# Patient Record
Sex: Male | Born: 1980 | Race: Black or African American | Hispanic: No | Marital: Married | Smoking: Former smoker
Health system: Southern US, Community
[De-identification: ages and names within clinical notes are randomized; demographics above are authoritative.]

## PROBLEM LIST (undated history)

## (undated) DIAGNOSIS — J45909 Unspecified asthma, uncomplicated: Secondary | ICD-10-CM

## (undated) HISTORY — PX: HIP SURGERY: SHX245

---
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
## 2003-08-16 ENCOUNTER — Emergency Department (HOSPITAL_COMMUNITY): Admission: EM | Admit: 2003-08-16 | Discharge: 2003-08-16 | Payer: Self-pay | Admitting: Emergency Medicine

## 2004-12-28 ENCOUNTER — Emergency Department (HOSPITAL_COMMUNITY): Admission: EM | Admit: 2004-12-28 | Discharge: 2004-12-28 | Payer: Self-pay | Admitting: Emergency Medicine

## 2007-04-02 ENCOUNTER — Emergency Department (HOSPITAL_COMMUNITY): Admission: EM | Admit: 2007-04-02 | Discharge: 2007-04-02 | Payer: Self-pay | Admitting: Emergency Medicine

## 2007-04-05 ENCOUNTER — Emergency Department (HOSPITAL_COMMUNITY): Admission: EM | Admit: 2007-04-05 | Discharge: 2007-04-05 | Payer: Self-pay | Admitting: Family Medicine

## 2007-07-19 ENCOUNTER — Emergency Department (HOSPITAL_COMMUNITY): Admission: EM | Admit: 2007-07-19 | Discharge: 2007-07-19 | Payer: Self-pay | Admitting: Family Medicine

## 2007-08-26 ENCOUNTER — Emergency Department (HOSPITAL_COMMUNITY): Admission: EM | Admit: 2007-08-26 | Discharge: 2007-08-26 | Payer: Self-pay | Admitting: Emergency Medicine

## 2008-10-06 ENCOUNTER — Emergency Department (HOSPITAL_COMMUNITY): Admission: EM | Admit: 2008-10-06 | Discharge: 2008-10-06 | Payer: Self-pay | Admitting: Family Medicine

## 2010-08-06 ENCOUNTER — Inpatient Hospital Stay (INDEPENDENT_AMBULATORY_CARE_PROVIDER_SITE_OTHER)
Admission: RE | Admit: 2010-08-06 | Discharge: 2010-08-06 | Disposition: A | Discharge status: Home / Self Care | Payer: Self-pay | Source: Ambulatory Visit | Attending: Emergency Medicine | Admitting: Emergency Medicine

## 2010-08-06 DIAGNOSIS — IMO0002 Reserved for concepts with insufficient information to code with codable children: Secondary | ICD-10-CM

## 2010-08-20 ENCOUNTER — Inpatient Hospital Stay (INDEPENDENT_AMBULATORY_CARE_PROVIDER_SITE_OTHER)
Admission: RE | Admit: 2010-08-20 | Discharge: 2010-08-20 | Disposition: A | Discharge status: Home / Self Care | Payer: Self-pay | Source: Ambulatory Visit | Attending: Family Medicine | Admitting: Family Medicine

## 2010-08-20 DIAGNOSIS — IMO0002 Reserved for concepts with insufficient information to code with codable children: Secondary | ICD-10-CM

## 2010-08-20 DIAGNOSIS — A64 Unspecified sexually transmitted disease: Secondary | ICD-10-CM

## 2010-08-21 LAB — GC/CHLAMYDIA PROBE AMP, GENITAL
Chlamydia, DNA Probe: NEGATIVE
GC Probe Amp, Genital: NEGATIVE

## 2010-10-20 ENCOUNTER — Emergency Department (HOSPITAL_COMMUNITY)
Admission: EM | Admit: 2010-10-20 | Discharge: 2010-10-20 | Disposition: A | Discharge status: Home / Self Care | Payer: Self-pay | Attending: Emergency Medicine | Admitting: Emergency Medicine

## 2010-10-20 DIAGNOSIS — W261XXA Contact with sword or dagger, initial encounter: Secondary | ICD-10-CM | POA: Insufficient documentation

## 2010-10-20 DIAGNOSIS — S61209A Unspecified open wound of unspecified finger without damage to nail, initial encounter: Secondary | ICD-10-CM | POA: Insufficient documentation

## 2010-10-20 DIAGNOSIS — W260XXA Contact with knife, initial encounter: Secondary | ICD-10-CM | POA: Insufficient documentation

## 2010-10-31 ENCOUNTER — Emergency Department (HOSPITAL_COMMUNITY)
Admission: EM | Admit: 2010-10-31 | Discharge: 2010-10-31 | Disposition: A | Discharge status: Home / Self Care | Payer: Self-pay | Attending: Emergency Medicine | Admitting: Emergency Medicine

## 2010-10-31 ENCOUNTER — Emergency Department (HOSPITAL_COMMUNITY): Payer: Self-pay

## 2010-10-31 DIAGNOSIS — S62639A Displaced fracture of distal phalanx of unspecified finger, initial encounter for closed fracture: Secondary | ICD-10-CM | POA: Insufficient documentation

## 2011-08-04 ENCOUNTER — Encounter (HOSPITAL_COMMUNITY): Payer: Self-pay | Admitting: *Deleted

## 2011-08-04 ENCOUNTER — Emergency Department (HOSPITAL_COMMUNITY)
Admission: EM | Admit: 2011-08-04 | Discharge: 2011-08-04 | Disposition: A | Discharge status: Home / Self Care | Payer: Self-pay | Attending: Emergency Medicine | Admitting: Emergency Medicine

## 2011-08-04 DIAGNOSIS — F411 Generalized anxiety disorder: Secondary | ICD-10-CM | POA: Insufficient documentation

## 2011-08-04 DIAGNOSIS — F419 Anxiety disorder, unspecified: Secondary | ICD-10-CM

## 2011-08-04 DIAGNOSIS — L259 Unspecified contact dermatitis, unspecified cause: Secondary | ICD-10-CM | POA: Insufficient documentation

## 2011-08-04 DIAGNOSIS — F17200 Nicotine dependence, unspecified, uncomplicated: Secondary | ICD-10-CM | POA: Insufficient documentation

## 2011-08-04 MED ORDER — PREDNISONE 20 MG PO TABS
60.0000 mg | ORAL_TABLET | Freq: Once | ORAL | Status: AC
  Administered 2011-08-04: 60 mg via ORAL
  Filled 2011-08-04: qty 3

## 2011-08-04 MED ORDER — PREDNISONE 10 MG PO TABS: 40.0000 mg | ORAL_TABLET | Freq: Every day | ORAL | Status: DC

## 2011-08-04 NOTE — ED Provider Notes (Signed)
History     CSN: [id number]  Arrival date & time 08/04/11  0207   First MD Initiated Contact with Patient 08/04/11 [phone]      Chief Complaint  Patient presents with  . Rash    (Consider location/radiation/quality/duration/timing/severity/associated sxs/prior treatment) Patient is a 31 y.o. male presenting with rash. The history is provided by the patient.  Rash  This is a chronic problem. The current episode started more than 1 week ago.   patient with complaint of rash to the back of both hands for the past 2 months. 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 Patient is also concerned about his heart going fast when he arrives heart rate was 105. Patient denied any crack or cocaine use but admitted to smoking marijuana. Denies chest pain shortness of breath headache abdominal pain nausea or vomiting. Also denies fever.  History reviewed. No pertinent past medical history.  History reviewed. No pertinent past surgical history.  No family history on file.  History  Substance Use Topics  . Smoking status: Current Everyday Smoker -- 0.5 packs/day  . Smokeless tobacco: Not on file  . Alcohol Use: Yes      Review of Systems  Constitutional: Negative for fever, chills, diaphoresis and fatigue.  HENT: Negative for congestion, sore throat and neck pain.   Eyes: Positive for redness. Negative for visual disturbance.  Respiratory: Negative for shortness of breath.   Cardiovascular: Negative for chest pain.  Gastrointestinal: Negative for nausea, vomiting and abdominal pain.  Genitourinary: Negative for dysuria.  Musculoskeletal: Negative for back pain.  Skin: Positive for rash.  Neurological: Negative for tremors, speech difficulty, weakness, numbness and headaches.    Allergies  Review of patient's allergies indicates no known allergies.  Home Medications   Current Outpatient Rx  Name Route Sig Dispense  Refill  . PREDNISONE 10 MG PO TABS Oral Take 4 tablets (40 mg total) by mouth daily. 20 tablet 0    BP 146/85  Pulse 128  Temp 98.8 F (37.1 C) (Oral)  Resp 23  Ht 5\' 7"  (1.702 m)  Wt 200 lb (90.719 kg)  BMI 31.32 kg/m2  SpO2 98%  Physical Exam  Nursing note and vitals reviewed. Constitutional: He is oriented to person, place, and time. He appears well-developed and well-nourished. No distress.  HENT:  Head: Normocephalic and atraumatic.  Mouth/Throat: Oropharynx is clear and moist.  Eyes: Conjunctivae and EOM are normal. Pupils are equal, round, and reactive to light.  Neck: Normal range of motion. Neck supple.  Cardiovascular: Regular rhythm, normal heart sounds and intact distal pulses.   No murmur heard.      Tachycardia ranging from 105-124.  Pulmonary/Chest: Effort normal and breath sounds normal. No respiratory distress. He has no wheezes. He has no rales. He exhibits no tenderness.  Abdominal: Soft. Bowel sounds are normal. There is no tenderness.  Musculoskeletal: Normal range of motion. He exhibits no edema and no tenderness.  Neurological: He is alert and oriented to person, place, and time. No cranial nerve deficit. He exhibits normal muscle tone. Coordination normal.  Skin: Skin is warm. Rash noted. There is erythema.       Rash with skin cracking at the knuckles on the backside of both hands along with some chronic skin thickening no vesicular lesions. Palms are normal. Suggestive of a chemical irritant or contact dermatitis type reaction.  Psychiatric:       Anxious  ED Course  Procedures (including critical care time)  Labs Reviewed - No data to display No results found.  Date: 08/04/2011  Rate: 124  Rhythm: sinus tachycardia  QRS Axis: normal  Intervals: normal  ST/T Wave abnormalities: nonspecific ST changes  Conduction Disutrbances:none  Narrative Interpretation:   Old EKG Reviewed: none available    1. Contact dermatitis   2. Anxiety        MDM  The patient with dried cracking dorsal part of the hands with cracking of the knuckles. Suggestive of ischemia or tender contact type dermatitis. Palms without any sniffing abnormalities. No other rash anywhere else. This will be treated with prednisone first dose given in the emergency apartment 60 mg and will go home with 40 mg for 5 days. Patient also try to wear gloves at work and keep his hands protected. If the prednisone does not help then recommend going with a trial of Lotrimin cream.   Second concern is been the patient's heart rate when at rest it goes down to about 105 as he gets worked up in concerned about the heart rate goes up to around 124 patient does admit to some marijuana use denies any crack or cocaine use. Denies any other systemic symptoms. Heart rate at rest like a settle be 105 respirations are fine sats are fine blood pressures slightly elevated.         Shelda Jakes, MD 08/04/11 [phone]

## 2011-08-04 NOTE — ED Notes (Addendum)
Pt pacing floor, questioning if I will need to put him to sleep, anxious, HR 130s, placed on monitior and reassurred. Given 2 warm blankets. Explained that we will not be putting him to sleep at this time. Questioned pt on whether he had used any illegal drugs, pt denies drug use stated, "I only smoke cigarettes and smoked pot" Hr comes down with reassurance. Pt states, "I need to calm down, I just need to bring it down. Its those cigarettes that do it"

## 2011-08-04 NOTE — ED Notes (Addendum)
Bilateral hand rash redness & raised bumps noted. R>L. Onset ~ 1-2 months ago. Describes skin as dry & cracked, primarily over the joints. Skin not intact, open cracks noted. Has been applying neosporin. Has had hand both covered and uncovered (wrapped and unwrapped). Rates pain 5/10. Some intermitant itching. Denies any known injury.

## 2011-08-04 NOTE — ED Notes (Signed)
The patient is AOx4 and comfortable with his discharge instructions. 

## 2011-08-31 ENCOUNTER — Emergency Department (INDEPENDENT_AMBULATORY_CARE_PROVIDER_SITE_OTHER): Payer: Self-pay

## 2011-08-31 ENCOUNTER — Encounter (HOSPITAL_COMMUNITY): Payer: Self-pay | Admitting: *Deleted

## 2011-08-31 ENCOUNTER — Emergency Department (INDEPENDENT_AMBULATORY_CARE_PROVIDER_SITE_OTHER)
Admission: EM | Admit: 2011-08-31 | Discharge: 2011-08-31 | Disposition: A | Discharge status: Home / Self Care | Payer: Self-pay | Source: Home / Self Care | Attending: Emergency Medicine | Admitting: Emergency Medicine

## 2011-08-31 DIAGNOSIS — M546 Pain in thoracic spine: Secondary | ICD-10-CM

## 2011-08-31 HISTORY — DX: Unspecified asthma, uncomplicated: J45.909

## 2011-08-31 MED ORDER — MELOXICAM 7.5 MG PO TABS: 7.5000 mg | ORAL_TABLET | Freq: Every day | ORAL | Status: AC

## 2011-08-31 NOTE — ED Notes (Signed)
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

## 2011-08-31 NOTE — ED Provider Notes (Addendum)
History     CSN: [id number]  Arrival date & time 08/31/11  1905   First MD Initiated Contact with Patient 08/31/11 1906      Chief Complaint  Patient presents with  . Shoulder Pain    (Consider location/radiation/quality/duration/timing/severity/associated sxs/prior treatment) HPI Comments: Daniel Joyce describes having  Constant pain on his back (points to interscapular region), similar to a pulling sensation" recalls no injuries or falls," I work in a Airline pilot", don't lift anything heavy, I want to make sure its not my lungs" No cough, no SOB, no fevers, no unintentional weight loss. No nausea. No increased sweating.  Patient is a 31 y.o. male presenting with shoulder pain. The history is provided by the patient.  Shoulder Pain This is a new problem. The current episode started more than 1 week ago. The problem occurs constantly. The problem has not changed since onset.Pertinent negatives include no chest pain, no abdominal pain, no headaches and no shortness of breath. The symptoms are aggravated by bending. Nothing relieves the symptoms. He has tried nothing for the symptoms. The treatment provided no relief.    Past Medical History  Diagnosis Date  . Asthma     History reviewed. No pertinent past surgical history.  Family History  Problem Relation Age of Onset  . Family history unknown: Yes    History  Substance Use Topics  . Smoking status: Current Everyday Smoker -- 0.5 packs/day    Types: Cigarettes  . Smokeless tobacco: Not on file  . Alcohol Use: Yes      Review of Systems  Constitutional: Positive for activity change. Negative for fever, chills, diaphoresis, appetite change and fatigue.  HENT: Negative for neck pain.   Respiratory: Negative for cough, chest tightness, shortness of breath and stridor.   Cardiovascular: Negative for chest pain, palpitations and leg swelling.  Gastrointestinal: Negative for nausea and abdominal pain.  Genitourinary: Negative  for dysuria, flank pain and penile swelling.  Musculoskeletal: Positive for back pain and gait problem. Negative for myalgias and joint swelling.  Skin: Negative for pallor, rash and wound.  Neurological: Negative for weakness, numbness and headaches.    Allergies  Review of patient's allergies indicates no known allergies.  Home Medications   Current Outpatient Rx  Name Route Sig Dispense Refill  . MELOXICAM 7.5 MG PO TABS Oral Take 1 tablet (7.5 mg total) by mouth daily. 14 tablet 0  . PREDNISONE 10 MG PO TABS Oral Take 4 tablets (40 mg total) by mouth daily. 20 tablet 0    BP 140/90  Pulse 90  Temp 98.6 F (37 C) (Oral)  Resp 18  SpO2 100%  Physical Exam  Nursing note reviewed. Constitutional: Vital signs are normal. He appears well-developed and well-nourished.  Non-toxic appearance. He does not have a sickly appearance. He does not appear ill. No distress.  Eyes: Conjunctivae are normal.  Cardiovascular: Normal rate, regular rhythm, normal heart sounds and normal pulses.   Pulmonary/Chest: Breath sounds normal. No accessory muscle usage. No apnea, not tachypneic and not bradypneic. No respiratory distress. He has no decreased breath sounds. He has no wheezes. He has no rhonchi. He has no rales.    Musculoskeletal: He exhibits tenderness. He exhibits no edema.  Neurological: He is alert.  Skin: No rash noted. No erythema.    ED Course  Procedures (including critical care time)  Labs Reviewed - No data to display Dg Chest 2 View  08/31/2011  *RADIOLOGY REPORT*  Clinical Data: Left shoulder pain,  tobacco use.  CHEST - 2 VIEW  Comparison: 05/08/1999 by report only  Findings: Lungs clear.  Heart size and pulmonary vascularity normal.  No effusion.  Visualized bones unremarkable.  IMPRESSION: No acute disease  Original Report Authenticated By: Thora Lance III, M.D.     1. Thoracic back pain    normal chest x-ray.    MDM  Chest x-ray-rhythm strip physical exam  unremarkable. Unable to reproduce pain with palpation but certain movements reproduced the pain. Discussed with patient hat symptoms should warrant further evaluation. A prescription of meloxicam to be completed in 2 weeks       Jimmie Molly, MD 08/31/11 1952  Jimmie Molly, MD 09/01/11 1652  Jimmie Molly, MD 09/01/11 [phone]

## 2012-09-13 ENCOUNTER — Emergency Department (HOSPITAL_COMMUNITY)
Admission: EM | Admit: 2012-09-13 | Discharge: 2012-09-13 | Disposition: A | Discharge status: Home / Self Care | Payer: Self-pay | Attending: Emergency Medicine | Admitting: Emergency Medicine

## 2012-09-13 ENCOUNTER — Emergency Department (HOSPITAL_COMMUNITY): Payer: Self-pay

## 2012-09-13 ENCOUNTER — Encounter (HOSPITAL_COMMUNITY): Payer: Self-pay | Admitting: Emergency Medicine

## 2012-09-13 DIAGNOSIS — R059 Cough, unspecified: Secondary | ICD-10-CM | POA: Insufficient documentation

## 2012-09-13 DIAGNOSIS — J45909 Unspecified asthma, uncomplicated: Secondary | ICD-10-CM | POA: Insufficient documentation

## 2012-09-13 DIAGNOSIS — F22 Delusional disorders: Secondary | ICD-10-CM | POA: Insufficient documentation

## 2012-09-13 DIAGNOSIS — R0789 Other chest pain: Secondary | ICD-10-CM | POA: Insufficient documentation

## 2012-09-13 DIAGNOSIS — R05 Cough: Secondary | ICD-10-CM | POA: Insufficient documentation

## 2012-09-13 DIAGNOSIS — J029 Acute pharyngitis, unspecified: Secondary | ICD-10-CM | POA: Insufficient documentation

## 2012-09-13 DIAGNOSIS — J3489 Other specified disorders of nose and nasal sinuses: Secondary | ICD-10-CM | POA: Insufficient documentation

## 2012-09-13 DIAGNOSIS — F411 Generalized anxiety disorder: Secondary | ICD-10-CM | POA: Insufficient documentation

## 2012-09-13 DIAGNOSIS — F419 Anxiety disorder, unspecified: Secondary | ICD-10-CM

## 2012-09-13 DIAGNOSIS — F17200 Nicotine dependence, unspecified, uncomplicated: Secondary | ICD-10-CM | POA: Insufficient documentation

## 2012-09-13 NOTE — ED Notes (Signed)
PT. REPORTS SORE THROAT AND LEFT LATERAL ABDOMINAL PAIN ONSET AFTER SMOKING " WEED" THIS MORNING . PT. APPEARS ANXIOUS AT TRIAGE . NO NAUSEA OR VOMITTING . RESPIRATIONS UNLABORED .

## 2012-09-13 NOTE — ED Notes (Signed)
Pt taken to xray 

## 2012-09-13 NOTE — ED Provider Notes (Signed)
CSN: [id number]     Arrival date & time 09/13/12  0530 History   First MD Initiated Contact with Patient 09/13/12 [phone]     Chief Complaint  Patient presents with  . Sore Throat   (Consider location/radiation/quality/duration/timing/severity/associated sxs/prior Treatment) Patient is a 32 y.o. male presenting with pharyngitis. The history is provided by the patient. No language interpreter was used.  Sore Throat This is a new problem. The current episode started today. Associated symptoms include chest pain, coughing and a sore throat. Pertinent negatives include no chills, fever, myalgias, nausea, rash or vomiting. Associated symptoms comments: He reports sore throat, chest tightness and left side pain since Saturday (3 days ago). No difficulty swallowing, he has been eating and drinking normally. He reports smoking a "black and mild" this morning but uses marijuana regularly with similar symptoms. No fever, N, V..    Past Medical History  Diagnosis Date  . Asthma    History reviewed. No pertinent past surgical history. No family history on file. History  Substance Use Topics  . Smoking status: Current Every Day Smoker -- 0.50 packs/day    Types: Cigarettes  . Smokeless tobacco: Not on file  . Alcohol Use: Yes    Review of Systems  Constitutional: Negative for fever and chills.  HENT: Positive for sore throat and rhinorrhea. Negative for trouble swallowing.   Respiratory: Positive for cough. Negative for shortness of breath.   Cardiovascular: Positive for chest pain. Negative for palpitations.       Chest pain with cough.  Gastrointestinal: Negative.  Negative for nausea and vomiting.  Musculoskeletal: Negative.  Negative for myalgias.  Skin: Negative.  Negative for rash.    Allergies  Review of patient's allergies indicates no known allergies.  Home Medications  No current outpatient prescriptions on file. BP 160/87  Pulse 101  Temp(Src) 98.7 F (37.1 C) (Oral)  Resp  21  SpO2 100% Physical Exam  Constitutional: He is oriented to person, place, and time. He appears well-developed and well-nourished.  HENT:  Head: Normocephalic.  Mouth/Throat: Oropharynx is clear and moist. No oropharyngeal exudate.  Neck: Normal range of motion. Neck supple.  Cardiovascular: Normal rate and regular rhythm.   No murmur heard. Pulmonary/Chest: Effort normal and breath sounds normal. He has no wheezes. He has no rales. He exhibits no tenderness.  Abdominal: Soft. Bowel sounds are normal. There is no tenderness. There is no rebound and no guarding.  Musculoskeletal: Normal range of motion.  Neurological: He is alert and oriented to person, place, and time.  Skin: Skin is warm and dry. No rash noted.  Psychiatric: His mood appears anxious. Thought content is paranoid.    ED Course  Procedures (including critical care time) Labs Review Labs Reviewed  URINE RAPID DRUG SCREEN (HOSP PERFORMED)  URINALYSIS, ROUTINE W REFLEX MICROSCOPIC   Results for orders placed during the hospital encounter of 08/20/10  GC/CHLAMYDIA PROBE AMP, GENITAL      Result Value Range   GC Probe Amp, Genital NEGATIVE  NEGATIVE   Chlamydia, DNA Probe NEGATIVE  NEGATIVE    Imaging Review No results found.  MDM  No diagnosis found. 1. Pharyngitis 2. Anxiety He appears anxious, borderline paranoid, with repeated questions focusing on what is wrong and "am I going to die" suggesting an irrational thought process. Considered potential necessity of getting [facility] involved, however, there is a friend at bedside who states when he is not smoking marijuana he does not behave in this way. Patient discharged home.  Arnoldo Hooker, PA-C 09/16/12 2257

## 2012-09-13 NOTE — ED Notes (Signed)
Patient is anxious. He wants to know if he has cancer. He has smoked marijuana and has been anxious since. States he normally doesn't smoke because it makes him very paranoid. Encouraged pt not to smoke marijuana. reasssurance given

## 2012-09-13 NOTE — ED Notes (Addendum)
Report received care assumed of pt. Pt is in xray.

## 2012-09-16 NOTE — ED Provider Notes (Signed)
Medical screening examination/treatment/procedure(s) were performed by non-physician practitioner and as supervising physician I was immediately available for consultation/collaboration.  Jasmine Awe, MD 09/16/12 2300

## 2012-12-14 ENCOUNTER — Encounter (HOSPITAL_COMMUNITY): Payer: Self-pay | Admitting: Emergency Medicine

## 2012-12-14 ENCOUNTER — Emergency Department (HOSPITAL_COMMUNITY): Payer: Medicaid Other

## 2012-12-14 ENCOUNTER — Other Ambulatory Visit: Payer: Self-pay

## 2012-12-14 ENCOUNTER — Emergency Department (HOSPITAL_COMMUNITY)
Admission: EM | Admit: 2012-12-14 | Discharge: 2012-12-14 | Disposition: A | Discharge status: Home / Self Care | Payer: Medicaid Other | Attending: Emergency Medicine | Admitting: Emergency Medicine

## 2012-12-14 DIAGNOSIS — R0789 Other chest pain: Secondary | ICD-10-CM | POA: Insufficient documentation

## 2012-12-14 DIAGNOSIS — J45909 Unspecified asthma, uncomplicated: Secondary | ICD-10-CM | POA: Insufficient documentation

## 2012-12-14 DIAGNOSIS — F17200 Nicotine dependence, unspecified, uncomplicated: Secondary | ICD-10-CM | POA: Insufficient documentation

## 2012-12-14 LAB — CBC
HCT: 45.3 % (ref 39.0–52.0)
MCH: 30.4 pg (ref 26.0–34.0)
MCV: 86.6 fL (ref 78.0–100.0)
Platelets: 301 10*3/uL (ref 150–400)
RBC: 5.23 MIL/uL (ref 4.22–5.81)
RDW: 13.7 % (ref 11.5–15.5)
WBC: 10.9 10*3/uL — ABNORMAL HIGH (ref 4.0–10.5)

## 2012-12-14 LAB — BASIC METABOLIC PANEL
BUN: 13 mg/dL (ref 6–23)
CO2: 29 mEq/L (ref 19–32)
Calcium: 9.7 mg/dL (ref 8.4–10.5)
Chloride: 101 mEq/L (ref 96–112)
Creatinine, Ser: 0.78 mg/dL (ref 0.50–1.35)

## 2012-12-14 LAB — POCT I-STAT TROPONIN I: Troponin i, poc: 0 ng/mL (ref 0.00–0.08)

## 2012-12-14 MED ORDER — OMEPRAZOLE 20 MG PO CPDR: 20.0000 mg | DELAYED_RELEASE_CAPSULE | Freq: Every day | ORAL | Status: DC

## 2012-12-14 NOTE — ED Notes (Addendum)
Pt. reports intermittent  left chest pain onset 4 days ago , denies SOB /nausea or diaphoresis. No cough or chest congestion . No chest pain at triage .

## 2012-12-14 NOTE — ED Notes (Signed)
Patient states he has mild chest pain intermittently last 4 days. Pain is sharp on left side, denies radiation down any arm or back etc, no associated n/v, diaphoresis.  No activity associated with pain and no specific activity relieves the pain.  Pain lasts seconds at a time.    Patient states that he has been eating a lot of hot food lately, not sure of symptoms of indigestion.  Denies regular alcohol use, does smoke regularly.    At the moment patient denies pain, sob, n/v, lightheadedness, dizziness, h/a, no pain or swelling in legs, no recent travel with long plane or train rides.  Patient states that he is otherwise a healthy person.

## 2012-12-14 NOTE — ED Notes (Signed)
MD at bedside. 

## 2012-12-14 NOTE — ED Notes (Signed)
Vital signs stable. 

## 2012-12-14 NOTE — ED Provider Notes (Signed)
CSN: [id number]     Arrival date & time 12/14/12  2007 History   First MD Initiated Contact with Patient 12/14/12 2112     Chief Complaint  Patient presents with  . Chest Pain   HPI  History provided by the patient. Patient is a 32 year old male with history of asthma who presents with concerns for brief episodes of left chest pain for the past 4-5 days. Patient reports feeling a sharp pain in his left chest and left upper abdomen area intermittently. Pain may last only a few seconds. It occurs infrequently throughout the day. He has not associated any particular activity with the pain. It may occur at rest or with activity. It does not seem aggravated with eating or drinking. Patient does mention that he has had increased spicy foods with increased hot sauces recently. He denies any belching or burning in the chest or throat. No prior history of heartburn symptoms. He denies any associated shortness of breath, diaphoresis or nausea. No recent illnesses or cough.    Past Medical History  Diagnosis Date  . Asthma    Past Surgical History  Procedure Laterality Date  . Hip surgery     No family history on file. History  Substance Use Topics  . Smoking status: Current Every Day Smoker -- 0.50 packs/day    Types: Cigarettes  . Smokeless tobacco: Not on file  . Alcohol Use: Yes    Review of Systems  Constitutional: Negative for fever, chills, diaphoresis and fatigue.  Respiratory: Negative for cough and shortness of breath.   Cardiovascular: Positive for chest pain. Negative for palpitations and leg swelling.  Gastrointestinal: Negative for nausea, vomiting and abdominal pain.  All other systems reviewed and are negative.    Allergies  Review of patient's allergies indicates no known allergies.  Home Medications  No current outpatient prescriptions on file. BP 142/82  Pulse 109  Temp(Src) 99 F (37.2 C) (Oral)  Resp 26  Wt 184 lb 4.8 oz (83.598 kg)  SpO2 99% Physical Exam   Nursing note and vitals reviewed. Constitutional: He is oriented to person, place, and time. He appears well-developed and well-nourished. No distress.  HENT:  Head: Normocephalic.  Eyes: Conjunctivae are normal.  Cardiovascular: Normal rate and regular rhythm.   No murmur heard. Pulmonary/Chest: Effort normal and breath sounds normal. No respiratory distress. He has no wheezes. He has no rales.  Abdominal: Soft. There is no tenderness. There is no rebound and no guarding.  Neurological: He is alert and oriented to person, place, and time.  Skin: Skin is warm. He is not diaphoretic.  Psychiatric: He has a normal mood and affect.    ED Course  Procedures   DIAGNOSTIC STUDIES: Oxygen Saturation is 99% on room air.    COORDINATION OF CARE:  Nursing notes reviewed. Vital signs reviewed. Initial pt interview and examination performed.   9:20 PM patient seen and evaluated. Patient resting comfortably appears well in no acute distress. He has regular heart rate with regular rhythm and no murmurs on exam. He has very atypical brief sharp left chest pain lasting only a few seconds intermittently for the past 4 days. Symptoms atypical for ACS. Patient without any significant risk factors except for smoking. Discussed work up plan with pt at bedside, which includes a sting, EKG and chest x-ray. Pt agrees with plan.   labs, chest x-ray, EKG and troponin unremarkable. Patient is low risk for ACS with atypical symptoms. No significant risk factors or symptoms for  PE. At this time do not suspect any concerning or emergent cause of his brief chest pain symptoms. Patient may be discharged with instructions to followup with PCP. He was also given strict return precautions.  Results for orders placed during the hospital encounter of 12/14/12  CBC      Result Value Range   WBC 10.9 (*) 4.0 - 10.5 K/uL   RBC 5.23  4.22 - 5.81 MIL/uL   Hemoglobin 15.9  13.0 - 17.0 g/dL   HCT 78.2  95.6 - 21.3 %   MCV  86.6  78.0 - 100.0 fL   MCH 30.4  26.0 - 34.0 pg   MCHC 35.1  30.0 - 36.0 g/dL   RDW 08.6  57.8 - 46.9 %   Platelets 301  150 - 400 K/uL  BASIC METABOLIC PANEL      Result Value Range   Sodium 139  135 - 145 mEq/L   Potassium 4.7  3.5 - 5.1 mEq/L   Chloride 101  96 - 112 mEq/L   CO2 29  19 - 32 mEq/L   Glucose, Bld 103 (*) 70 - 99 mg/dL   BUN 13  6 - 23 mg/dL   Creatinine, Ser 6.29  0.50 - 1.35 mg/dL   Calcium 9.7  8.4 - 52.8 mg/dL   GFR calc non Af Amer >90  >90 mL/min   GFR calc Af Amer >90  >90 mL/min  POCT I-STAT TROPONIN I      Result Value Range   Troponin i, poc 0.00  0.00 - 0.08 ng/mL   Comment 3                Imaging Review Dg Chest 2 View  12/14/2012   CLINICAL DATA:  Chest pain.  EXAM: CHEST  2 VIEW  COMPARISON:  September 13, 2012.  FINDINGS: The heart size and mediastinal contours are within normal limits. Both lungs are clear. No pleural effusion or pneumothorax is noted. The visualized skeletal structures are unremarkable.  IMPRESSION: No acute cardiopulmonary abnormality seen.   Electronically Signed   By: Roque Lias M.D.   On: 12/14/2012 20:52    EKG Interpretation   None      Date: 12/14/2012  Rate: 97  Rhythm: normal sinus rhythm  QRS Axis: normal  Intervals: normal  ST/T Wave abnormalities: nonspecific ST changes  Conduction Disutrbances:none  Narrative Interpretation:   Old EKG Reviewed: none available    MDM   1. Atypical chest pain        Angus Seller, PA-C 12/14/12 2200

## 2012-12-15 NOTE — ED Provider Notes (Signed)
Medical screening examination/treatment/procedure(s) were performed by non-physician practitioner and as supervising physician I was immediately available for consultation/collaboration.  EKG Interpretation    Date/Time:  Wednesday December 14 2012 21:16:02 EST Ventricular Rate:  97 PR Interval:  141 QRS Duration: 103 QT Interval:  338 QTC Calculation: 429 R Axis:   45 Text Interpretation:  Sinus rhythm RSR' in V1 or V2, probably normal variant No significant change since last tracing Confirmed by Loran Auguste  MD, Jaryd Drew [phone]) on 12/14/2012 11:53:29 PM              Randa Spike Mort Sawyers, MD 12/15/12 1625

## 2013-01-02 ENCOUNTER — Telehealth: Payer: Self-pay | Admitting: Emergency Medicine

## 2013-01-02 NOTE — Telephone Encounter (Signed)
Burnadette from [facility] called wanting our NPI for pt.  Pt has never been here but he is unable to get an appt here w/in 1 month He is needing a physical.... Auth a x1 visit but adv Burnadette that pt needs to est care w/us

## 2014-01-04 IMAGING — CR DG CHEST 2V
2 series · 2 of 2 positions shown · non-contrast
Comparison: September 13, 2012.

CLINICAL DATA: Chest pain.

EXAM:
CHEST  2 VIEW

[w chest pa]
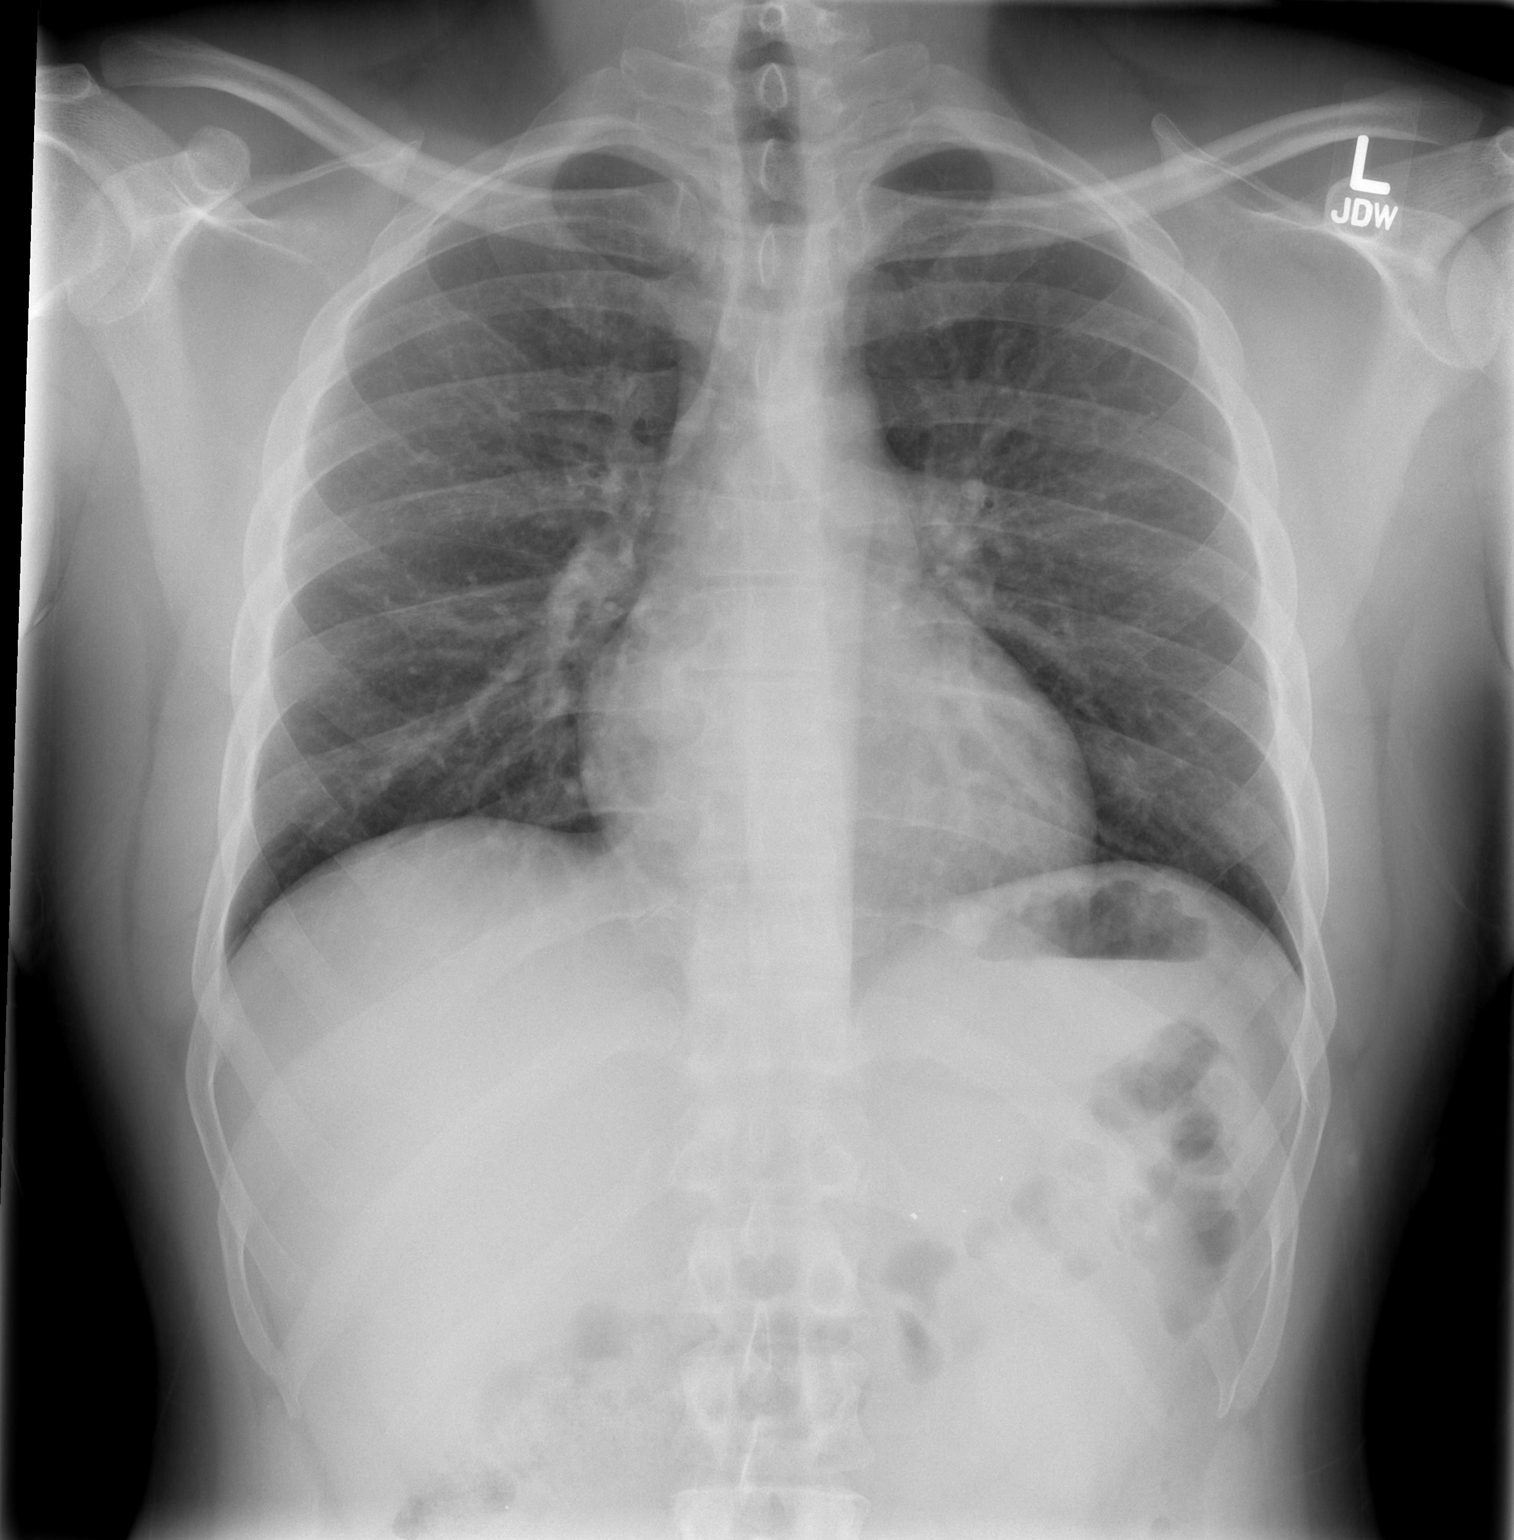

[w chest lat]
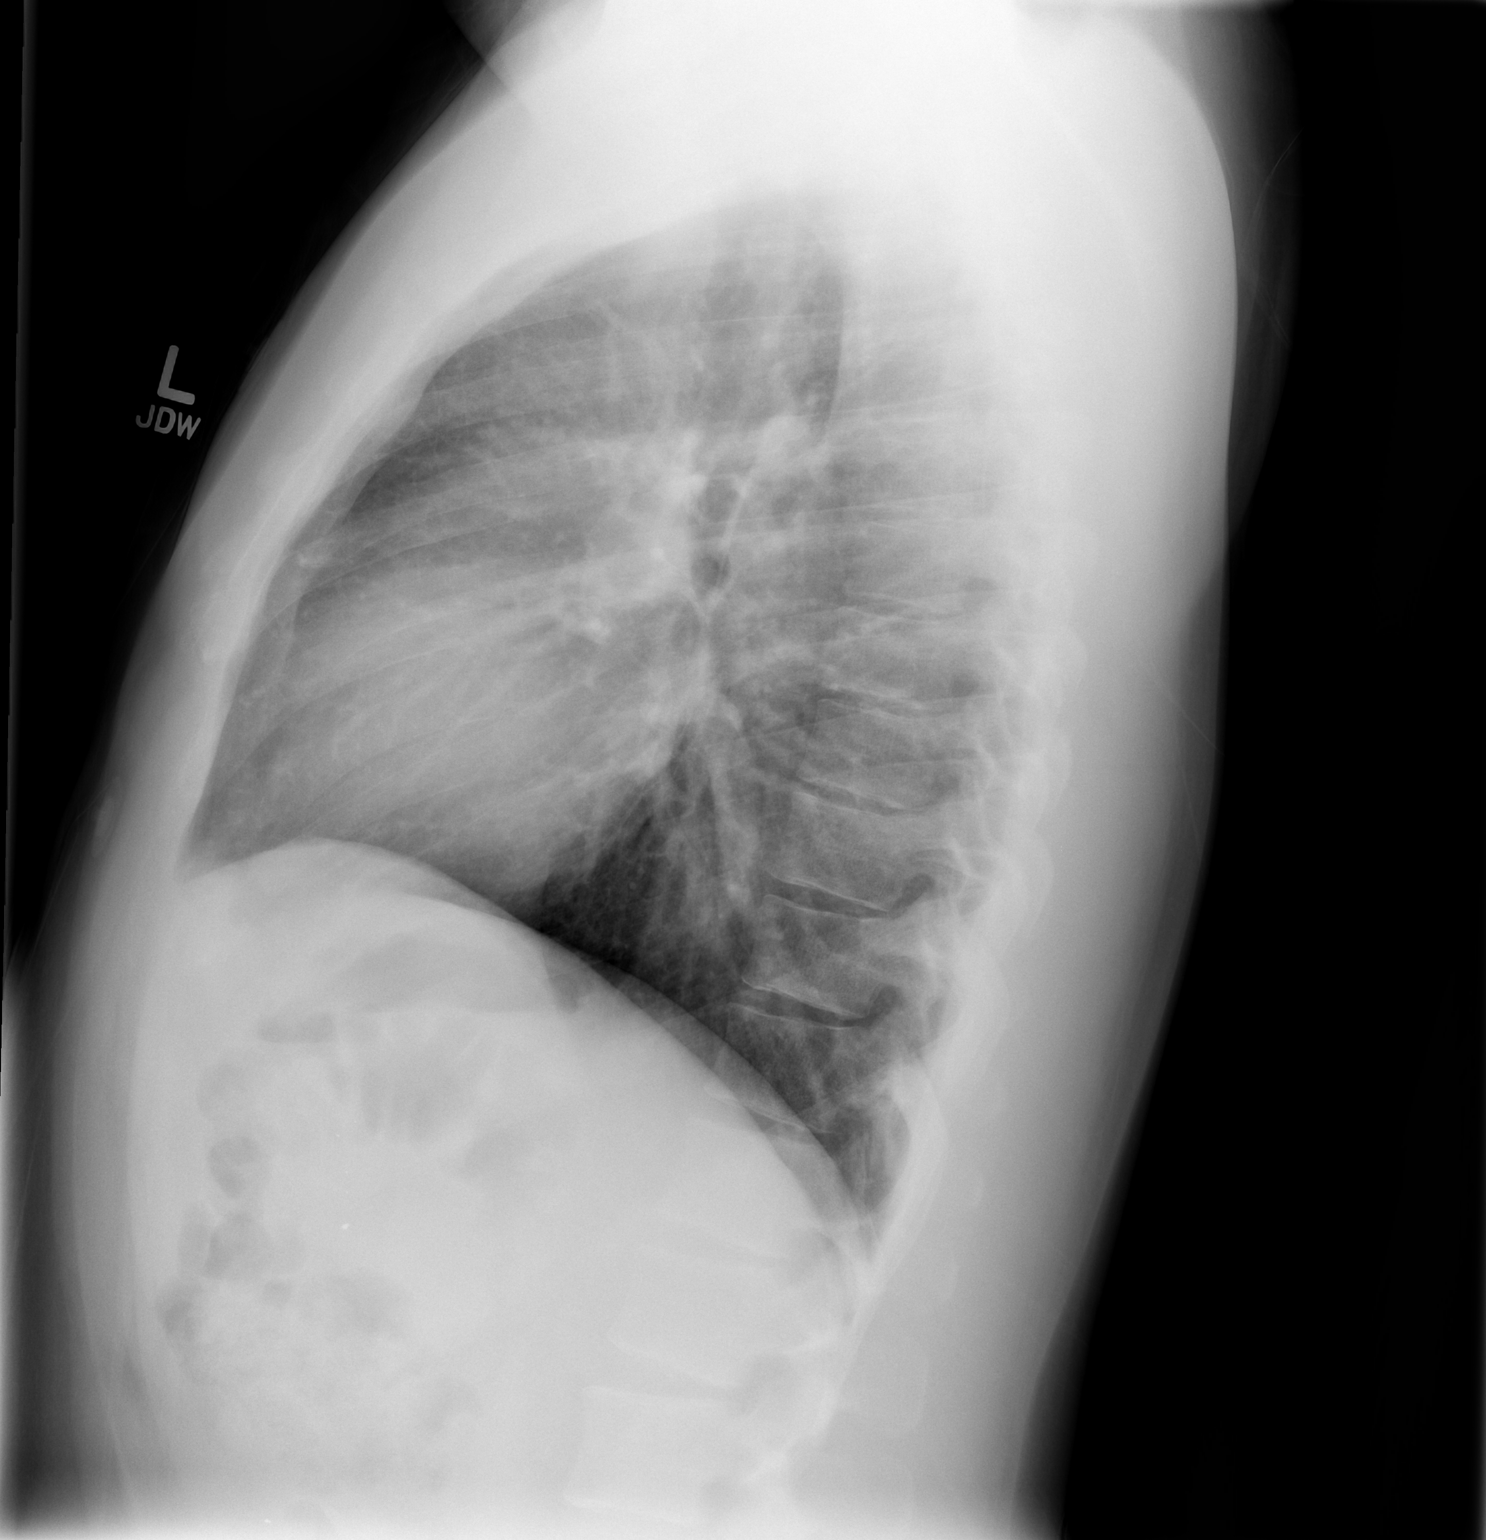

[2 of 2 positions shown; findings below may reference images not displayed]

FINDINGS: The heart size and mediastinal contours are within normal limits.
Both lungs are clear. No pleural effusion or pneumothorax is noted.
The visualized skeletal structures are unremarkable.
IMPRESSION: No acute cardiopulmonary abnormality seen.

## 2014-02-19 ENCOUNTER — Emergency Department (HOSPITAL_COMMUNITY)
Admission: EM | Admit: 2014-02-19 | Discharge: 2014-02-19 | Disposition: A | Discharge status: Home / Self Care | Payer: Medicaid Other | Source: Home / Self Care | Attending: Family Medicine | Admitting: Family Medicine

## 2014-02-19 ENCOUNTER — Encounter (HOSPITAL_COMMUNITY): Payer: Self-pay | Admitting: Emergency Medicine

## 2014-02-19 DIAGNOSIS — S29011A Strain of muscle and tendon of front wall of thorax, initial encounter: Secondary | ICD-10-CM

## 2014-02-19 DIAGNOSIS — R030 Elevated blood-pressure reading, without diagnosis of hypertension: Secondary | ICD-10-CM | POA: Diagnosis not present

## 2014-02-19 DIAGNOSIS — IMO0001 Reserved for inherently not codable concepts without codable children: Secondary | ICD-10-CM

## 2014-02-19 NOTE — ED Notes (Signed)
Pt states that since 02/17/2014 night he has had chest pain only if he presses on his chest on the left side. Pt denies any shortness of breath, sweating.

## 2014-02-19 NOTE — Discharge Instructions (Signed)
As we discussed, vary your forms of exercise to rest your chest wall so as not to further muscle strain. Ibuprofen as directed on packaging for discomfort. Contact [facility] to arrange for follow up evaluation for your elevated blood pressure. Should you develop persistent chest discomfort, shortness of breath, or other worrisome symptoms, please seek immediate re-evaluation at your nearest ER.  DASH Eating Plan DASH stands for "Dietary Approaches to Stop Hypertension." The DASH eating plan is a healthy eating plan that has been shown to reduce high blood pressure (hypertension). Additional health benefits may include reducing the risk of type 2 diabetes mellitus, heart disease, and stroke. The DASH eating plan may also help with weight loss. WHAT DO I NEED TO KNOW ABOUT THE DASH EATING PLAN? For the DASH eating plan, you will follow these general guidelines:  Choose foods with a percent daily value for sodium of less than 5% (as listed on the food label).  Use salt-free seasonings or herbs instead of table salt or sea salt.  Check with your health care provider or pharmacist before using salt substitutes.  Eat lower-sodium products, often labeled as "lower sodium" or "no salt added."  Eat fresh foods.  Eat more vegetables, fruits, and low-fat dairy products.  Choose whole grains. Look for the word "whole" as the first word in the ingredient list.  Choose fish and skinless chicken or [place] more often than red meat. Limit fish, poultry, and meat to 6 oz (170 g) each day.  Limit sweets, desserts, sugars, and sugary drinks.  Choose heart-healthy fats.  Limit cheese to 1 oz (28 g) per day.  Eat more home-cooked food and less restaurant, buffet, and fast food.  Limit fried foods.  Cook foods using methods other than frying.  Limit canned vegetables. If you do use them, rinse them well to decrease the sodium.  When eating at a restaurant, ask that your food be  prepared with less salt, or no salt if possible. WHAT FOODS CAN I EAT? Seek help from a dietitian for individual calorie needs. Grains Whole grain or whole wheat bread. Brown rice. Whole grain or whole wheat pasta. Quinoa, bulgur, and whole grain cereals. Low-sodium cereals. Corn or whole wheat flour tortillas. Whole grain cornbread. Whole grain crackers. Low-sodium crackers. Vegetables Fresh or frozen vegetables (raw, steamed, roasted, or grilled). Low-sodium or reduced-sodium tomato and vegetable juices. Low-sodium or reduced-sodium tomato sauce and paste. Low-sodium or reduced-sodium canned vegetables.  Fruits All fresh, canned (in natural juice), or frozen fruits. Meat and Other Protein Products Ground beef (85% or leaner), grass-fed beef, or beef trimmed of fat. Skinless chicken or [place]. Ground chicken or [place]. Pork trimmed of fat. All fish and seafood. Eggs. Dried beans, peas, or lentils. Unsalted nuts and seeds. Unsalted canned beans. Dairy Low-fat dairy products, such as skim or 1% milk, 2% or reduced-fat cheeses, low-fat ricotta or cottage cheese, or plain low-fat yogurt. Low-sodium or reduced-sodium cheeses. Fats and Oils Tub margarines without trans fats. Light or reduced-fat mayonnaise and salad dressings (reduced sodium). Avocado. Safflower, olive, or canola oils. Natural peanut or almond butter. Other Unsalted popcorn and pretzels. The items listed above may not be a complete list of recommended foods or beverages. Contact your dietitian for more options. WHAT FOODS ARE NOT RECOMMENDED? Grains White bread. White pasta. White rice. Refined cornbread. Bagels and croissants. Crackers that contain trans fat. Vegetables Creamed or fried vegetables. Vegetables in a cheese sauce. Regular canned vegetables. Regular canned tomato sauce and  paste. Regular tomato and vegetable juices. Fruits Dried fruits. Canned fruit in light or heavy syrup. Fruit juice. Meat and Other Protein  Products Fatty cuts of meat. Ribs, chicken wings, bacon, sausage, bologna, salami, chitterlings, fatback, hot dogs, bratwurst, and packaged luncheon meats. Salted nuts and seeds. Canned beans with salt. Dairy Whole or 2% milk, cream, half-and-half, and cream cheese. Whole-fat or sweetened yogurt. Full-fat cheeses or blue cheese. Nondairy creamers and whipped toppings. Processed cheese, cheese spreads, or cheese curds. Condiments Onion and garlic salt, seasoned salt, table salt, and sea salt. Canned and packaged gravies. Worcestershire sauce. Tartar sauce. Barbecue sauce. Teriyaki sauce. Soy sauce, including reduced sodium. Steak sauce. Fish sauce. Oyster sauce. Cocktail sauce. Horseradish. Ketchup and mustard. Meat flavorings and tenderizers. Bouillon cubes. Hot sauce. Tabasco sauce. Marinades. Taco seasonings. Relishes. Fats and Oils Butter, stick margarine, lard, shortening, ghee, and bacon fat. Coconut, palm kernel, or palm oils. Regular salad dressings. Other Pickles and olives. Salted popcorn and pretzels. The items listed above may not be a complete list of foods and beverages to avoid. Contact your dietitian for more information. WHERE CAN I FIND MORE INFORMATION? National Heart, Lung, and Blood Institute: CableProm[employer].itwww.nhlbi.nih.gov/health/health-topics/topics/dash/ Document Released: 12/25/2010 Document Revised: 05/22/2013 Document Reviewed: 11/09/2012 [facility] Patient Information 2015 [place], [place]. This information is not intended to replace advice given to you by your health care provider. Make sure you discuss any questions you have with your health care provider.  Hypertension Hypertension, commonly called high blood pressure, is when the force of blood pumping through your arteries is too strong. Your arteries are the blood vessels that carry blood from your heart throughout your body. A blood pressure reading consists of a higher number over a lower number, such as 110/72. The higher number  (systolic) is the pressure inside your arteries when your heart pumps. The lower number (diastolic) is the pressure inside your arteries when your heart relaxes. Ideally you want your blood pressure below 120/80. Hypertension forces your heart to work harder to pump blood. Your arteries may become narrow or stiff. Having hypertension puts you at risk for heart disease, stroke, and other problems.  RISK FACTORS Some risk factors for high blood pressure are controllable. Others are not.  Risk factors you cannot control include:   Race. You may be at higher risk if you are African American.  Age. Risk increases with age.  Gender. Men are at higher risk than women before age 32 years. After age 32, women are at higher risk than men. Risk factors you can control include:  Not getting enough exercise or physical activity.  Being overweight.  Getting too much fat, sugar, calories, or salt in your diet.  Drinking too much alcohol. SIGNS AND SYMPTOMS Hypertension does not usually cause signs or symptoms. Extremely high blood pressure (hypertensive crisis) may cause headache, anxiety, shortness of breath, and nosebleed. DIAGNOSIS  To check if you have hypertension, your health care provider will measure your blood pressure while you are seated, with your arm held at the level of your heart. It should be measured at least twice using the same arm. Certain conditions can cause a difference in blood pressure between your right and left arms. A blood pressure reading that is higher than normal on one occasion does not mean that you need treatment. If one blood pressure reading is high, ask your health care provider about having it checked again. TREATMENT  Treating high blood pressure includes making lifestyle changes and possibly taking medicine. Living a healthy  lifestyle can help lower high blood pressure. You may need to change some of your habits. Lifestyle changes may include:  Following the DASH  diet. This diet is high in fruits, vegetables, and whole grains. It is low in salt, red meat, and added sugars.  Getting at least 2 hours of brisk physical activity every week.  Losing weight if necessary.  Not smoking.  Limiting alcoholic beverages.  Learning ways to reduce stress. If lifestyle changes are not enough to get your blood pressure under control, your health care provider may prescribe medicine. You may need to take more than one. Work closely with your health care provider to understand the risks and benefits. HOME CARE INSTRUCTIONS  Have your blood pressure rechecked as directed by your health care provider.   Take medicines only as directed by your health care provider. Follow the directions carefully. Blood pressure medicines must be taken as prescribed. The medicine does not work as well when you skip doses. Skipping doses also puts you at risk for problems.   Do not smoke.   Monitor your blood pressure at home as directed by your health care provider. SEEK MEDICAL CARE IF:   You think you are having a reaction to medicines taken.  You have recurrent headaches or feel dizzy.  You have swelling in your ankles.  You have trouble with your vision. SEEK IMMEDIATE MEDICAL CARE IF:  You develop a severe headache or confusion.  You have unusual weakness, numbness, or feel faint.  You have severe chest or abdominal pain.  You vomit repeatedly.  You have trouble breathing. MAKE SURE YOU:   Understand these instructions.  Will watch your condition.  Will get help right away if you are not doing well or get worse. Document Released: 01/05/2005 Document Revised: 05/22/2013 Document Reviewed: 10/28/2012 [facility] Patient Information 2015 [place], [place]. This information is not intended to replace advice given to you by your health care provider. Make sure you discuss any questions you have with your health care provider.  Muscle Strain A muscle strain is  an injury that occurs when a muscle is stretched beyond its normal length. Usually a small number of muscle fibers are torn when this happens. Muscle strain is rated in degrees. First-degree strains have the least amount of muscle fiber tearing and pain. Second-degree and third-degree strains have increasingly more tearing and pain.  Usually, recovery from muscle strain takes 1-2 weeks. Complete healing takes 5-6 weeks.  CAUSES  Muscle strain happens when a sudden, violent force placed on a muscle stretches it too far. This may occur with lifting, sports, or a fall.  RISK FACTORS Muscle strain is especially common in athletes.  SIGNS AND SYMPTOMS At the site of the muscle strain, there may be:  Pain.  Bruising.  Swelling.  Difficulty using the muscle due to pain or lack of normal function. DIAGNOSIS  Your health care provider will perform a physical exam and ask about your medical history. TREATMENT  Often, the best treatment for a muscle strain is resting, icing, and applying cold compresses to the injured area.  HOME CARE INSTRUCTIONS   Use the PRICE method of treatment to promote muscle healing during the first 2-3 days after your injury. The PRICE method involves:  Protecting the muscle from being injured again.  Restricting your activity and resting the injured body part.  Icing your injury. To do this, put ice in a plastic bag. Place a towel between your skin and the bag. Then, apply  the ice and leave it on from 15-20 minutes each hour. After the third day, switch to moist heat packs.  Apply compression to the injured area with a splint or elastic bandage. Be careful not to wrap it too tightly. This may interfere with blood circulation or increase swelling.  Elevate the injured body part above the level of your heart as often as you can.  Only take over-the-counter or prescription medicines for pain, discomfort, or fever as directed by your health care provider.  Warming up  prior to exercise helps to prevent future muscle strains. SEEK MEDICAL CARE IF:   You have increasing pain or swelling in the injured area.  You have numbness, tingling, or a significant loss of strength in the injured area. MAKE SURE YOU:   Understand these instructions.  Will watch your condition.  Will get help right away if you are not doing well or get worse. Document Released: 01/05/2005 Document Revised: 10/26/2012 Document Reviewed: 08/04/2012 [facility] Patient Information 2015 [place], [place]. This information is not intended to replace advice given to you by your health care provider. Make sure you discuss any questions you have with your health care provider.

## 2014-02-19 NOTE — ED Provider Notes (Signed)
CSN: [id number]     Arrival date & time 02/19/14  1316 History   First MD Initiated Contact with Patient 02/19/14 1454     Chief Complaint  Patient presents with  . Chest Pain   (Consider location/radiation/quality/duration/timing/severity/associated sxs/prior Treatment) HPI Comments: Patient reports he noticed some left lateral chest wall discomfort on 02/17/2014. This was a brief "stinging" sensation lasting 1-2 seconds. This comes after he began, over past 1-2 weeks, doing push ups at home for exercise. Since 02/17/2014, he has noticed on occasion when he presses on his chest with his fingers, he can occasionally reproduce some mild discomfort. Has not taken any medications at home for his symptoms.  No fever, cough, hemoptysis, dyspnea, rash, dizziness or exacerbation with exertion.  PCP: [facility] On 02/18/2014, he states he was at local [facility] and decided to check his BP while he was there and found it to be elevated. He does not recall having elevated BP in the past.   Patient is a 34 y.o. male presenting with chest pain. The history is provided by the patient.  Chest Pain Pain location:  L chest Pain quality: sharp   Pain radiates to:  Does not radiate Pain radiates to the back: no   Pain severity:  Mild Duration:  3 days   Past Medical History  Diagnosis Date  . Asthma    Past Surgical History  Procedure Laterality Date  . Hip surgery     History reviewed. No pertinent family history. History  Substance Use Topics  . Smoking status: Current Every Day Smoker -- 0.50 packs/day    Types: Cigarettes  . Smokeless tobacco: Not on file  . Alcohol Use: Yes    Review of Systems  Cardiovascular: Positive for chest pain.  All other systems reviewed and are negative.   Allergies  Review of patient's allergies indicates no known allergies.  Home Medications   Prior to Admission medications   Medication Sig Start Date End Date Taking? Authorizing Provider  omeprazole (PRILOSEC) 20  MG capsule Take 1 capsule (20 mg total) by mouth daily. 12/14/12   Phill MutterPeter S Dammen, PA-C   BP 152/99 mmHg  Pulse 89  Temp(Src) 98.7 F (37.1 C) (Oral)  Resp 16  SpO2 95% Physical Exam  Constitutional: He is oriented to person, place, and time. He appears well-developed and well-nourished. No distress.  HENT:  Head: Normocephalic and atraumatic.  Cardiovascular: Normal rate, regular rhythm and normal heart sounds.   Pulmonary/Chest: Effort normal and breath sounds normal. No tachypnea.    Outlined area is region of reported discomfort, however, patient reports himself to be symptom free today during exam.   Abdominal: Soft. Bowel sounds are normal. He exhibits no distension. There is no tenderness.  Musculoskeletal: Normal range of motion.  Neurological: He is alert and oriented to person, place, and time.  Skin: Skin is warm and dry. No rash noted. No erythema.  Psychiatric: He has a normal mood and affect. His behavior is normal.  Nursing note and vitals reviewed.   ED Course  Procedures (including critical care time) Labs Review Labs Reviewed - No data to display  Imaging Review No results found.   MDM   1. Chest wall muscle strain, initial encounter   2. Elevated blood pressure    Reproducible chest wall tenderness in otherwise healthy male who recently began doing push ups at home for exercise. Vital signs are noted to have elevated BP and I discussed this with patient and encouraged him to  follow up with his PCP at [facility] as soon as possible for evaluation of HTN. Advised him to vary his forms of exercise and use ibuprofen as directed on packaging for discomfort. Strongly advised to report to his nearest ER should he develop persistent chest pain, dyspnea, fever, hemoptysis.   Ria Clock, [place] 02/19/14 [phone]

## 2014-02-20 ENCOUNTER — Emergency Department (HOSPITAL_COMMUNITY)
Admission: EM | Admit: 2014-02-20 | Discharge: 2014-02-20 | Disposition: A | Discharge status: Home / Self Care | Payer: Medicaid Other | Attending: Emergency Medicine | Admitting: Emergency Medicine

## 2014-02-20 ENCOUNTER — Encounter (HOSPITAL_COMMUNITY): Payer: Self-pay | Admitting: Adult Health

## 2014-02-20 DIAGNOSIS — I10 Essential (primary) hypertension: Secondary | ICD-10-CM | POA: Diagnosis present

## 2014-02-20 DIAGNOSIS — Z720 Tobacco use: Secondary | ICD-10-CM | POA: Diagnosis not present

## 2014-02-20 DIAGNOSIS — J45909 Unspecified asthma, uncomplicated: Secondary | ICD-10-CM | POA: Insufficient documentation

## 2014-02-20 DIAGNOSIS — Z79899 Other long term (current) drug therapy: Secondary | ICD-10-CM | POA: Diagnosis not present

## 2014-02-20 NOTE — Discharge Instructions (Signed)
Hypertension °Hypertension, commonly called high blood pressure, is when the force of blood pumping through your arteries is too strong. Your arteries are the blood vessels that carry blood from your heart throughout your body. A blood pressure reading consists of a higher number over a lower number, such as 110/72. The higher number (systolic) is the pressure inside your arteries when your heart pumps. The lower number (diastolic) is the pressure inside your arteries when your heart relaxes. Ideally you want your blood pressure below 120/80. °Hypertension forces your heart to work harder to pump blood. Your arteries may become narrow or stiff. Having hypertension puts you at risk for heart disease, stroke, and other problems.  °RISK FACTORS °Some risk factors for high blood pressure are controllable. Others are not.  °Risk factors you cannot control include:  °· Race. You may be at higher risk if you are African American. °· Age. Risk increases with age. °· Gender. Men are at higher risk than women before age 45 years. After age 65, women are at higher risk than men. °Risk factors you can control include: °· Not getting enough exercise or physical activity. °· Being overweight. °· Getting too much fat, sugar, calories, or salt in your diet. °· Drinking too much alcohol. °SIGNS AND SYMPTOMS °Hypertension does not usually cause signs or symptoms. Extremely high blood pressure (hypertensive crisis) may cause headache, anxiety, shortness of breath, and nosebleed. °DIAGNOSIS  °To check if you have hypertension, your health care provider will measure your blood pressure while you are seated, with your arm held at the level of your heart. It should be measured at least twice using the same arm. Certain conditions can cause a difference in blood pressure between your right and left arms. A blood pressure reading that is higher than normal on one occasion does not mean that you need treatment. If one blood pressure reading  is high, ask your health care provider about having it checked again. °TREATMENT  °Treating high blood pressure includes making lifestyle changes and possibly taking medicine. Living a healthy lifestyle can help lower high blood pressure. You may need to change some of your habits. °Lifestyle changes may include: °· Following the DASH diet. This diet is high in fruits, vegetables, and whole grains. It is low in salt, red meat, and added sugars. °· Getting at least 2½ hours of brisk physical activity every week. °· Losing weight if necessary. °· Not smoking. °· Limiting alcoholic beverages. °· Learning ways to reduce stress. ° If lifestyle changes are not enough to get your blood pressure under control, your health care provider may prescribe medicine. You may need to take more than one. Work closely with your health care provider to understand the risks and benefits. °HOME CARE INSTRUCTIONS °· Have your blood pressure rechecked as directed by your health care provider.   °· Take medicines only as directed by your health care provider. Follow the directions carefully. Blood pressure medicines must be taken as prescribed. The medicine does not work as well when you skip doses. Skipping doses also puts you at risk for problems.   °· Do not smoke.   °· Monitor your blood pressure at home as directed by your health care provider.  °SEEK MEDICAL CARE IF:  °· You think you are having a reaction to medicines taken. °· You have recurrent headaches or feel dizzy. °· You have swelling in your ankles. °· You have trouble with your vision. °SEEK IMMEDIATE MEDICAL CARE IF: °· You develop a severe headache or confusion. °·   You have unusual weakness, numbness, or feel faint.  You have severe chest or abdominal pain.  You vomit repeatedly.  You have trouble breathing. MAKE SURE YOU:   Understand these instructions.  Will watch your condition.  Will get help right away if you are not doing well or get worse. Document  Released: 01/05/2005 Document Revised: 05/22/2013 Document Reviewed: 10/28/2012 [facility] Patient Information 2015 [place], [place]. This information is not intended to replace advice given to you by your health care provider. Make sure you discuss any questions you have with your health care provider.   Emergency Department Resource Guide 1) Find a Doctor and Pay Out of Pocket Although you won't have to find out who is covered by your insurance plan, it is a good idea to ask around and get recommendations. You will then need to call the office and see if the doctor you have chosen will accept you as a new patient and what types of options they offer for patients who are self-pay. Some doctors offer discounts or will set up payment plans for their patients who do not have insurance, but you will need to ask so you aren't surprised when you get to your appointment.  2) Contact Your Local Health Department Not all health departments have doctors that can see patients for sick visits, but many do, so it is worth a call to see if yours does. If you don't know where your local health department is, you can check in your phone book. The CDC also has a tool to help you locate your state's health department, and many state websites also have listings of all of their local health departments.  3) Find a Walk-in Clinic If your illness is not likely to be very severe or complicated, you may want to try a walk in clinic. These are popping up all over the country in pharmacies, drugstores, and shopping centers. They're usually staffed by nurse practitioners or physician assistants that have been trained to treat common illnesses and complaints. They're usually fairly quick and inexpensive. However, if you have serious medical issues or chronic medical problems, these are probably not your best option.  No Primary Care Doctor: - Call [facility] at  [phone] - they can help you locate a primary care doctor that   accepts your insurance, provides certain services, etc. - Physician Referral Service- [phone]

## 2014-02-20 NOTE — ED Provider Notes (Signed)
CSN: [id number]     Arrival date & time 02/20/14  1807 History   First MD Initiated Contact with Patient 02/20/14 1923     Chief Complaint  Patient presents with  . Hypertension     (Consider location/radiation/quality/duration/timing/severity/associated sxs/prior Treatment) Patient is a 34 y.o. male presenting with hypertension. The history is provided by the patient. No language interpreter was used.  Hypertension This is a new problem. The current episode started yesterday. The problem occurs constantly. The problem has been unchanged. Pertinent negatives include no abdominal pain, anorexia, chest pain, coughing, fatigue, fever, nausea, numbness, vomiting or weakness. Nothing aggravates the symptoms. He has tried nothing for the symptoms.    Past Medical History  Diagnosis Date  . Asthma    Past Surgical History  Procedure Laterality Date  . Hip surgery     History reviewed. No pertinent family history. History  Substance Use Topics  . Smoking status: Current Every Day Smoker -- 0.50 packs/day    Types: Cigarettes  . Smokeless tobacco: Not on file  . Alcohol Use: Yes    Review of Systems  Constitutional: Negative for fever and fatigue.  Respiratory: Negative for cough.   Cardiovascular: Negative for chest pain.  Gastrointestinal: Negative for nausea, vomiting, abdominal pain and anorexia.  Genitourinary: Negative for dysuria, urgency and frequency.  Neurological: Negative for weakness and numbness.  All other systems reviewed and are negative.     Allergies  Review of patient's allergies indicates no known allergies.  Home Medications   Prior to Admission medications   Medication Sig Start Date End Date Taking? Authorizing Provider  omeprazole (PRILOSEC) 20 MG capsule Take 1 capsule (20 mg total) by mouth daily. 12/14/12   Phill Mutter Dammen, PA-C   BP 167/89 mmHg  Pulse 76  Temp(Src) 98.3 F (36.8 C) (Oral)  Resp 20  SpO2 98% Physical Exam  Constitutional:  He is oriented to person, place, and time. He appears well-developed and well-nourished. No distress.  HENT:  Head: Normocephalic and atraumatic.  Eyes: Pupils are equal, round, and reactive to light.  Neck: Normal range of motion.  Cardiovascular: Normal rate, regular rhythm and normal heart sounds.   Pulmonary/Chest: Effort normal and breath sounds normal. No respiratory distress. He has no decreased breath sounds. He has no wheezes. He has no rhonchi. He has no rales.  Abdominal: Soft. He exhibits no distension. There is no tenderness. There is no rebound and no guarding.  Musculoskeletal: He exhibits no edema or tenderness.  Neurological: He is alert and oriented to person, place, and time. No cranial nerve deficit or sensory deficit. He exhibits normal muscle tone.  Strength 5/5 bilateral upper and lower extremities.  Sensation intact x4 extremities.  CN II-XII intact.    Skin: Skin is warm and dry.  Nursing note and vitals reviewed.   ED Course  Procedures (including critical care time) Labs Review Labs Reviewed - No data to display  Imaging Review No results found.   EKG Interpretation   Date/Time:  Tuesday February 20 2014 19:42:32 EST Ventricular Rate:  88 PR Interval:  142 QRS Duration: 102 QT Interval:  352 QTC Calculation: 426 R Axis:   30 Text Interpretation:  Sinus rhythm Borderline ST elevation, anterolateral  leads Baseline wander in lead(s) I III aVL V1 No significant change since  last tracing Confirmed by HARRISON  MD, FORREST ([id number]) on 02/20/2014 7:45:57  PM      MDM   Final diagnoses:  Essential hypertension   Patient  is a 34 year old African-American male with no pertinent past medical history who comes to the emergency department today with hypertension which he has had for the past couple of days. Physical exam as above. Patient was concerned that he may have a stroke. Patient has no symptoms at this time. He denies chest pain, blurred vision,  weakness, numbness, headaches, or shortness of breath. Patient's blood pressure is 150/90 here. Patient does have a primary care visit scheduled for 3 months from now, but was concerned that he could not make it until then. Patient was informed that the first line treatment for hypertension was diet and exercise. Patient expressed that he will start this at home. Patient was felt to be stable for discharge with his asymptomatic hypertension at this time. Patient was instructed to return to the emergency department with weakness, numbness, chest pain, headaches, or any other concerns. Patient was discharged in good condition. His care was discussed with my attending Dr. Romeo AppleHarrison.       Bethann BerkshireAaron Thalia Turkington, MD 02/21/14 1251  Purvis SheffieldForrest Harrison, MD 02/21/14 Mikle Bosworth1902

## 2014-02-20 NOTE — ED Notes (Signed)
Presents with 2 days of Hypertension-[facility] had [facility] BP [facility] yesterday-today took his BP and it was 167/103-he denies pain, endorses headache last night. He is concerned because he signed up for a primary care doctor but they told him he could not get in for 3-6 months and does not want to have a stroke or heart attack. BP here 140/102

## 2014-12-15 ENCOUNTER — Emergency Department (INDEPENDENT_AMBULATORY_CARE_PROVIDER_SITE_OTHER)
Admission: EM | Admit: 2014-12-15 | Discharge: 2014-12-15 | Disposition: A | Discharge status: Home / Self Care | Payer: Medicaid Other | Source: Home / Self Care | Attending: Emergency Medicine | Admitting: Emergency Medicine

## 2014-12-15 ENCOUNTER — Encounter (HOSPITAL_COMMUNITY): Payer: Self-pay | Admitting: *Deleted

## 2014-12-15 DIAGNOSIS — M791 Myalgia, unspecified site: Secondary | ICD-10-CM

## 2014-12-15 DIAGNOSIS — R030 Elevated blood-pressure reading, without diagnosis of hypertension: Secondary | ICD-10-CM

## 2014-12-15 MED ORDER — IBUPROFEN 800 MG PO TABS: 800.0000 mg | ORAL_TABLET | Freq: Four times a day (QID) | ORAL | Status: DC | PRN

## 2014-12-15 MED ORDER — METAXALONE 800 MG PO TABS
800.0000 mg | ORAL_TABLET | Freq: Three times a day (TID) | ORAL | Status: DC
Start: 2014-12-15 — End: 2022-04-02

## 2014-12-15 NOTE — ED Provider Notes (Signed)
HPI  SUBJECTIVE:  Daniel Joyce is a 34 y.o. male who presents with upper right trapezius and right anterior chest soreness described as "pulling" starting approximately 4 months ago after doing a lot of pull-ups/pushups. States that it is present only when stretching, not any other time. It is worse with doing pushups/upper body workout, better with ibuprofen 400 mg, rest, stretching. He denies nausea, vomiting, fevers, unintentional weight loss, erythema, neck pain, numbness, tingling in his arm, rash, trauma to his neck or chest. Past medical history negative for cancer, multiple myeloma, osteopenia, prolonged steroid use. He does not carry a formal diagnosis of hypertension. He is a smoker.    Past Medical History  Diagnosis Date  . Asthma     Past Surgical History  Procedure Laterality Date  . Hip surgery      No family history on file.  Social History  Substance Use Topics  . Smoking status: Current Every Day Smoker -- 0.50 packs/day    Types: Cigarettes  . Smokeless tobacco: None  . Alcohol Use: Yes    No current facility-administered medications for this encounter.  Current outpatient prescriptions:  .  ibuprofen (ADVIL,MOTRIN) 800 MG tablet, Take 1 tablet (800 mg total) by mouth every 6 (six) hours as needed., Disp: 30 tablet, Rfl: 0 .  metaxalone (SKELAXIN) 800 MG tablet, Take 1 tablet (800 mg total) by mouth 3 (three) times daily., Disp: 21 tablet, Rfl: 0 .  omeprazole (PRILOSEC) 20 MG capsule, Take 1 capsule (20 mg total) by mouth daily., Disp: 30 capsule, Rfl: 0  No Known Allergies   ROS  As noted in HPI.   Physical Exam  BP 142/102 mmHg  Pulse 90  Temp(Src) 98.3 F (36.8 C) (Oral)  Resp 18  SpO2 98%  Constitutional: Well developed, well nourished, no acute distress Eyes:  EOMI, conjunctiva normal bilaterally HENT: Normocephalic, atraumatic,mucus membranes moist Respiratory: Normal inspiratory effort Cardiovascular: Normal rate GI:  nondistended skin: No rash, skin intact Musculoskeletal: No pain with palpation and trapezius, rhomboid, pectoral muscle. No muscle spasm.  Shoulder, upper extremity exam within normal limits. Patient is neurovascularly intact distally. Radial pulses 2+. no deformities Neurologic: Alert & oriented x 3, no focal neuro deficits. Strength flexion/extension at the shoulder, elbow 5/5 equal bilaterally. Grip strength 5/5 and equal bilaterally. Psychiatric: Speech and behavior appropriate   ED Course   Medications - No data to display  No orders of the defined types were placed in this encounter.    No results found for this or any previous visit (from the past 24 hour(s)). No results found.  ED Clinical Impression  Elevated blood pressure reading  Muscle soreness   ED Assessment/Plan  Feel that this is some residual inflammation that has not completely resolved given that patient has not completely quit working out, and that he is not taking regular NSAIDs. No evidence of bony injury, vascular compromise, nerve involvement. Deferring imaging today We'll start him on 10 days of ibuprofen 3 times a day. Home with Skelaxin in case symptoms get worse or recur after working out.  Blood pressure noted. Patient denies any signs or symptoms of hypertensive emergency most notably blurry vision and headache focal neurologic deficits, chest pain, shortness of breath, pain tearing through to his back, anuria, lower extremity edema. Advised lifestyle modifications and follow-up with primary care physician of his choice. Advised patient to keep a log of his  blood pressures and to measure it once a day every day, preferably at the same  time. Patient may return here if he has not found a primary care physician within the next several weeks for blood pressure recheck. .  Discussed MDM, plan and followup with patient. Discussed sn/sx that should prompt return to the UC or ED. Patient agrees with plan.    *This clinic note was created using Dragon dictation software. Therefore, there may be occasional mistakes despite careful proofreading.  ?    Melynda Ripple, MD 12/16/14 1149

## 2014-12-15 NOTE — ED Notes (Signed)
Approx 4 months ago, the day after doing pull-ups, noticed right upper back (trapezius area) and right upper chest pain with certain movements.  Pain has been improving, but continues.  No pain at rest nor with palpation; pain when trying to stretch torso up tall.  Advil helps.  Pt has stopped working out.

## 2014-12-15 NOTE — ED Notes (Signed)
Pt states he has been told in past his BP is elevated.  Discussed importance of establishing PCP and not letting HTN go untreated.

## 2014-12-15 NOTE — Discharge Instructions (Signed)
Decrease your salt intake. diet and exercise will lower your blood pressure significantly. It is important to keep your blood pressure under good control, as having a elevated for prolonged periods of time significantly increases your risk of stroke, heart attacks, kidney damage, eye damage, and other problems. Return here in several weeks to a month for a blood pressure recheck if you're able to find a primary care physician by then. Return immediately to the ER if you start having chest pain, headache, problems seeing, problems talking, problems walking, if you feel like you're about to pass out, if you do pass out, if you have a seizure, or for any other concerns..  Follow up with  [facility] [address] [place], [place]  [id number] [phone]

## 2015-04-16 ENCOUNTER — Ambulatory Visit: Payer: Medicaid Other | Admitting: Internal Medicine

## 2015-04-29 ENCOUNTER — Ambulatory Visit: Payer: Medicaid Other | Admitting: Internal Medicine

## 2015-05-22 ENCOUNTER — Encounter (HOSPITAL_COMMUNITY): Payer: Self-pay | Admitting: *Deleted

## 2015-05-22 ENCOUNTER — Ambulatory Visit (HOSPITAL_COMMUNITY)
Admission: EM | Admit: 2015-05-22 | Discharge: 2015-05-22 | Disposition: A | Discharge status: Home / Self Care | Payer: Medicaid Other | Attending: Family Medicine | Admitting: Family Medicine

## 2015-05-22 DIAGNOSIS — S39012A Strain of muscle, fascia and tendon of lower back, initial encounter: Secondary | ICD-10-CM

## 2015-05-22 MED ORDER — IBUPROFEN 800 MG PO TABS
800.0000 mg | ORAL_TABLET | Freq: Three times a day (TID) | ORAL | Status: DC
Start: 2015-05-22 — End: 2022-04-02

## 2015-05-22 MED ORDER — CYCLOBENZAPRINE HCL 5 MG PO TABS: 5.0000 mg | ORAL_TABLET | Freq: Three times a day (TID) | ORAL | Status: DC | PRN

## 2015-05-22 NOTE — ED Notes (Signed)
Pt  Reports   Back  Pain  For  About  10  Days    -  Denys   Any  specfic    Injury   However       Pain  Is worse  On  Movement     And  Certain  posistions     Pt  denys  Any  Urinary  Symptoms       He  Is  Sitting  Upright on the  Exam table  Speaking in  Complete  sentances

## 2015-05-22 NOTE — ED Provider Notes (Signed)
CSN: [id number]     Arrival date & time 05/22/15  1258 History   First MD Initiated Contact with Patient 05/22/15 1327     Chief Complaint  Patient presents with  . Back Pain   (Consider location/radiation/quality/duration/timing/severity/associated sxs/prior Treatment) Patient is a 35 y.o. male presenting with back pain. The history is provided by the patient.  Back Pain Location:  Lumbar spine Quality:  Stiffness Radiates to:  Does not radiate Pain severity:  Mild Onset quality:  Gradual Duration:  10 days Progression:  Unchanged Chronicity:  New Context: lifting heavy objects and physical stress   Context: not recent illness, not recent injury and not twisting   Relieved by:  None tried Worsened by:  Nothing tried Ineffective treatments:  None tried Associated symptoms: no dysuria, no fever, no leg pain, no paresthesias, no pelvic pain and no perianal numbness     Past Medical History  Diagnosis Date  . Asthma    Past Surgical History  Procedure Laterality Date  . Hip surgery     History reviewed. No pertinent family history. Social History  Substance Use Topics  . Smoking status: Current Every Day Smoker -- 0.50 packs/day    Types: Cigarettes  . Smokeless tobacco: None  . Alcohol Use: Yes    Review of Systems  Constitutional: Negative.  Negative for fever.  Gastrointestinal: Negative.   Genitourinary: Negative.  Negative for dysuria and pelvic pain.  Musculoskeletal: Positive for back pain. Negative for joint swelling and gait problem.  Skin: Negative.   Neurological: Negative for paresthesias.  All other systems reviewed and are negative.   Allergies  Review of patient's allergies indicates no known allergies.  Home Medications   Prior to Admission medications   Medication Sig Start Date End Date Taking? Authorizing Provider  cyclobenzaprine (FLEXERIL) 5 MG tablet Take 1 tablet (5 mg total) by mouth 3 (three) times daily as needed for muscle spasms.  05/22/15   Linna Hoff, MD  ibuprofen (ADVIL,MOTRIN) 800 MG tablet Take 1 tablet (800 mg total) by mouth 3 (three) times daily. For back pain 05/22/15   Linna Hoff, MD  metaxalone (SKELAXIN) 800 MG tablet Take 1 tablet (800 mg total) by mouth 3 (three) times daily. 12/15/14   Domenick Gong, MD  omeprazole (PRILOSEC) 20 MG capsule Take 1 capsule (20 mg total) by mouth daily. 12/14/12   Ivonne Andrew, PA-C   Meds Ordered and Administered this Visit  Medications - No data to display  BP 168/99 mmHg  Pulse 78  Temp(Src) 98.6 F (37 C) (Oral)  Resp 18  SpO2 99% No data found.   Physical Exam  Constitutional: He is oriented to person, place, and time. He appears well-developed and well-nourished. No distress.  Abdominal: Soft. Bowel sounds are normal. There is no tenderness.  Musculoskeletal: Normal range of motion. He exhibits tenderness.       Lumbar back: He exhibits tenderness, bony tenderness, pain and spasm. He exhibits no deformity and normal pulse.       Back:  Neurological: He is alert and oriented to person, place, and time.  Skin: Skin is warm and dry.  Nursing note and vitals reviewed.   ED Course  Procedures (including critical care time)  Labs Review Labs Reviewed - No data to display  Imaging Review No results found.   Visual Acuity Review  Right Eye Distance:   Left Eye Distance:   Bilateral Distance:    Right Eye Near:   Left Eye Near:  Bilateral Near:         MDM   1. Strain of lumbar paraspinal muscle, initial encounter        Linna HoffJames D Kolten Ryback, MD 05/23/15 [phone]

## 2016-03-11 ENCOUNTER — Encounter: Payer: Self-pay | Admitting: Podiatry

## 2016-03-11 ENCOUNTER — Ambulatory Visit (INDEPENDENT_AMBULATORY_CARE_PROVIDER_SITE_OTHER): Payer: Medicaid Other | Admitting: Podiatry

## 2016-03-11 ENCOUNTER — Ambulatory Visit (INDEPENDENT_AMBULATORY_CARE_PROVIDER_SITE_OTHER): Payer: Medicaid Other

## 2016-03-11 DIAGNOSIS — M7752 Other enthesopathy of left foot: Secondary | ICD-10-CM

## 2016-03-11 DIAGNOSIS — M79609 Pain in unspecified limb: Secondary | ICD-10-CM

## 2016-03-11 DIAGNOSIS — M25571 Pain in right ankle and joints of right foot: Secondary | ICD-10-CM

## 2016-03-11 DIAGNOSIS — M722 Plantar fascial fibromatosis: Secondary | ICD-10-CM

## 2016-03-11 DIAGNOSIS — M659 Synovitis and tenosynovitis, unspecified: Secondary | ICD-10-CM

## 2016-03-11 DIAGNOSIS — M7751 Other enthesopathy of right foot: Secondary | ICD-10-CM

## 2016-03-11 DIAGNOSIS — L608 Other nail disorders: Secondary | ICD-10-CM

## 2016-03-11 DIAGNOSIS — B351 Tinea unguium: Secondary | ICD-10-CM | POA: Diagnosis not present

## 2016-03-11 DIAGNOSIS — L603 Nail dystrophy: Secondary | ICD-10-CM

## 2016-03-11 DIAGNOSIS — Z79899 Other long term (current) drug therapy: Secondary | ICD-10-CM

## 2016-03-11 LAB — HEPATIC FUNCTION PANEL
ALBUMIN: 4.7 g/dL (ref 3.6–5.1)
ALT: 25 U/L (ref 9–46)
AST: 20 U/L (ref 10–40)
Alkaline Phosphatase: 78 U/L (ref 40–115)
BILIRUBIN TOTAL: 0.5 mg/dL (ref 0.2–1.2)
Bilirubin, Direct: 0.1 mg/dL (ref <=0.2)
Indirect Bilirubin: 0.4 mg/dL (ref 0.2–1.2)
Total Protein: 8 g/dL (ref 6.1–8.1)

## 2016-03-11 MED ORDER — TERBINAFINE HCL 250 MG PO TABS: 250.0000 mg | ORAL_TABLET | Freq: Every day | ORAL | 0 refills | Status: DC

## 2016-03-11 MED ORDER — BETAMETHASONE SOD PHOS & ACET 6 (3-3) MG/ML IJ SUSP: 3.0000 mg | Freq: Once | INTRAMUSCULAR | Status: AC

## 2016-03-11 NOTE — Progress Notes (Signed)
   Subjective:  Patient presents today for evaluation of right ankle pain. He states that ithe experience during regular activity however when he runshe expenses significant pain after running. Patient runs approximately 5 miles a day. This pain has been going on for approximately 1 year now. No alleviating factors other than rest.  Patient also complains of possible fungus to the right great toenail. He states that he did apply topical over-the-counter antifungalfor a couple of months but he didn't notice any changes.    Objective / Physical Exam:  General:  The patient is alert and oriented x3 in no acute distress. Dermatology:  Onychodystrophy nail noted to the right great toenail Consistent with onychomycosis. Skin is warm, dry and supple bilateral lower extremities. Negative for open lesions or macerations. Vascular:  Palpable pedal pulses bilaterally. No edema or erythema noted. Capillary refill within normal limits. Neurological:  Epicritic and protective threshold grossly intact bilaterally.  Musculoskeletal Exam:  Pain on palpation to the anterior lateral medial aspects of the patient's left ankle. Mild edema noted.  Range of motion within normal limits to all pedal and ankle joints bilateral. Muscle strength 5/5 in all groups bilateral.   Radiographic Exam:  Normal osseous mineralization. Joint spaces preserved. No fracture/dislocation/boney destruction.    Assessment: #1 pain in right ankle #2 synovitis with capsulitis of right ankle #3  Onychomycosis right great toenail #4 pes planus bilateral  Plan of Care:  #1 Patient was evaluated. #2 injection of 0.5 mL Celestone Soluspan injected in the patient's right ankle. #3 recommend over-the-counter super feet insoles #4 prescription for terbinafine 250 mg #90  #5 order for liver function test  #6 return to clinic in 4 months   contact patient regarding liver function test results  Felecia ShellingBrent M. Shalissa Easterwood, DPM [facility]  Dr. Felecia ShellingBrent M. Eliyanah Elgersma, DPM    [address]                                        [place], [place] [address]                Office [phone]  Fax [phone]

## 2016-03-13 ENCOUNTER — Telehealth: Payer: Self-pay | Admitting: *Deleted

## 2016-03-13 MED ORDER — NONFORMULARY OR COMPOUNDED ITEM: 2 refills | Status: AC

## 2016-03-13 MED ORDER — TERBINAFINE HCL 250 MG PO TABS: 250.0000 mg | ORAL_TABLET | Freq: Every day | ORAL | 0 refills | Status: DC

## 2016-03-13 MED ORDER — TERBINAFINE HCL 250 MG PO TABS: 250.0000 mg | ORAL_TABLET | Freq: Every day | ORAL | 0 refills | Status: AC

## 2016-03-13 NOTE — Telephone Encounter (Addendum)
Dr. Logan BoresEvans states Liver function test is normal, may begin the lamisil and to start the [employer] onychomycosis nail lacquer as soon as it arrives. Pt asked if he could drink while taking the lamisil and I told him no it could cause liver damage and described symptoms of liver problems, nausea, vomiting, yellow eyes and or skin, dark urine, and left side pain. I informed pt that [facility] had lamisil #90 for $12.00 and [facility] would not take his medicaid. Pt states uses [facility] [address]. [employer] onychomycosis nail lacquer faxed.

## 2016-06-10 ENCOUNTER — Ambulatory Visit: Payer: Medicaid Other | Admitting: Podiatry

## 2017-11-03 ENCOUNTER — Ambulatory Visit (HOSPITAL_COMMUNITY)
Admission: EM | Admit: 2017-11-03 | Discharge: 2017-11-03 | Disposition: A | Discharge status: Home / Self Care | Payer: Medicaid Other | Attending: Family Medicine | Admitting: Family Medicine

## 2017-11-03 ENCOUNTER — Other Ambulatory Visit: Payer: Self-pay

## 2017-11-03 ENCOUNTER — Encounter (HOSPITAL_COMMUNITY): Payer: Self-pay | Admitting: Emergency Medicine

## 2017-11-03 DIAGNOSIS — L739 Follicular disorder, unspecified: Secondary | ICD-10-CM | POA: Diagnosis not present

## 2017-11-03 MED ORDER — SULFAMETHOXAZOLE-TRIMETHOPRIM 800-160 MG PO TABS: 1.0000 | ORAL_TABLET | Freq: Two times a day (BID) | ORAL | 0 refills | Status: AC

## 2017-11-03 NOTE — ED Triage Notes (Signed)
Abscess to left axilla.  Patient noticed this area yesterday.  Patient has squeezed this area and has poked this area with a pin

## 2017-11-03 NOTE — Discharge Instructions (Signed)
Return here if not seeing significant improvement over the next 48-72 hours. You may use over the counter ibuprofen or acetaminophen as needed.

## 2017-11-09 NOTE — ED Provider Notes (Signed)
[facility]   [id number] 11/03/17 Arrival Time: 1801  ASSESSMENT & PLAN:  1. Folliculitis    No I&D attempt tonight. Desires trial of: Meds ordered this encounter  Medications  . sulfamethoxazole-trimethoprim (BACTRIM DS,SEPTRA DS) 800-160 MG tablet    Sig: Take 1 tablet by mouth 2 (two) times daily for 10 days.    Dispense:  20 tablet    Refill:  0   Finish all antibiotics. OTC analgesics as needed.  Will f/u here if not showing improvement over the next 24-48 hours.  Reviewed expectations re: course of current medical issues. Questions answered. Outlined signs and symptoms indicating need for more acute intervention. Patient verbalized understanding. After Visit Summary given.   SUBJECTIVE:  Daniel Joyce is a 37 y.o. male who presents with a possible skin infection of his L axilla. Onset gradual, approximately 1 day ago. Some tenderness. No drainage. Afebrile. "Poked it with a pin and I think some yellow stuff came out." None since. This was yesterday. Minimal discomfort. No specific aggravating or alleviating factors reported. No OTC treatment. No injury to area. Does not shave here.  ROS: As per HPI.  OBJECTIVE:  Vitals:   11/03/17 1838 11/03/17 1902  BP: (!) 164/102   Pulse: (!) 133 92  Resp: 18   Temp: 99.2 F (37.3 C)   TempSrc: Oral   SpO2: 98%     General appearance: alert; no distress Neck: supple without LAD CV: tachycardic initially; after discussion HR decreased to 102; regular Skin: L axilla with a 1-2 cm area of erythema with slight skin thickening and without obvious fluctuance; tender to touch; no active drainage Ext: LUE with from Psychological: alert and cooperative; normal mood and affect  No Known Allergies  Past Medical History:  Diagnosis Date  . Asthma    Social History   Socioeconomic History  . Marital status: Married    Spouse name: Not on file  . Number of children: Not on file  . Years of education: Not on file    . Highest education level: Not on file  Occupational History  . Not on file  Social Needs  . Financial resource strain: Not on file  . Food insecurity:    Worry: Not on file    Inability: Not on file  . Transportation needs:    Medical: Not on file    Non-medical: Not on file  Tobacco Use  . Smoking status: Former Smoker    Packs/day: 0.50    Types: Cigarettes    Last attempt to quit: 08/20/2015    Years since quitting: 2.2  . Smokeless tobacco: Never Used  Substance and Sexual Activity  . Alcohol use: Yes  . Drug use: Not Currently    Types: Marijuana    Comment: Hx marijuana use per record  . Sexual activity: Not on file  Lifestyle  . Physical activity:    Days per week: Not on file    Minutes per session: Not on file  . Stress: Not on file  Relationships  . Social connections:    Talks on phone: Not on file    Gets together: Not on file    Attends religious service: Not on file    Active member of club or organization: Not on file    Attends meetings of clubs or organizations: Not on file    Relationship status: Not on file  Other Topics Concern  . Not on file  Social History Narrative  . Not on  file   Family History  Family history unknown: Yes   Past Surgical History:  Procedure Laterality Date  . HIP SURGERY             Mardella Layman, MD 11/09/17 1040

## 2022-04-02 ENCOUNTER — Encounter (HOSPITAL_COMMUNITY): Payer: Self-pay | Admitting: Emergency Medicine

## 2022-04-02 ENCOUNTER — Other Ambulatory Visit: Payer: Self-pay

## 2022-04-02 ENCOUNTER — Ambulatory Visit (HOSPITAL_COMMUNITY)
Admission: EM | Admit: 2022-04-02 | Discharge: 2022-04-02 | Disposition: A | Discharge status: Home / Self Care | Payer: Medicaid Other | Attending: Emergency Medicine | Admitting: Emergency Medicine

## 2022-04-02 DIAGNOSIS — M545 Low back pain, unspecified: Secondary | ICD-10-CM | POA: Diagnosis not present

## 2022-04-02 MED ORDER — CYCLOBENZAPRINE HCL 10 MG PO TABS: 10.0000 mg | ORAL_TABLET | Freq: Two times a day (BID) | ORAL | 0 refills | Status: AC | PRN

## 2022-04-02 MED ORDER — CYCLOBENZAPRINE HCL 10 MG PO TABS: 10.0000 mg | ORAL_TABLET | Freq: Two times a day (BID) | ORAL | 0 refills | Status: DC | PRN

## 2022-04-02 MED ORDER — KETOROLAC TROMETHAMINE 30 MG/ML IJ SOLN
INTRAMUSCULAR | Status: AC
  Filled 2022-04-02: qty 1

## 2022-04-02 MED ORDER — IBUPROFEN 800 MG PO TABS: 800.0000 mg | ORAL_TABLET | Freq: Four times a day (QID) | ORAL | 0 refills | Status: AC | PRN

## 2022-04-02 MED ORDER — KETOROLAC TROMETHAMINE 30 MG/ML IJ SOLN
30.0000 mg | Freq: Once | INTRAMUSCULAR | Status: AC
  Administered 2022-04-02: 30 mg via INTRAMUSCULAR

## 2022-04-02 MED ORDER — IBUPROFEN 800 MG PO TABS: 800.0000 mg | ORAL_TABLET | Freq: Four times a day (QID) | ORAL | 0 refills | Status: DC | PRN

## 2022-04-02 NOTE — ED Provider Notes (Signed)
[facility]    CSN: [id number] Arrival date & time: 04/02/22  [place]      History   Chief Complaint Chief Complaint  Patient presents with   Back Pain    HPI Daniel Joyce is a 42 y.o. male.  4-day history of low back pain Started after heavy lifting at the gym on Sunday.  Denies any other injury or trauma. Continued lifting through this week. No fevers, weakness of the extremities, bowel or bladder dysfunction He tried 200 mg ibuprofen yesterday  Past Medical History:  Diagnosis Date   Asthma     There are no problems to display for this patient.   Past Surgical History:  Procedure Laterality Date   HIP SURGERY         Home Medications    Prior to Admission medications   Medication Sig Start Date End Date Taking? Authorizing Provider  cyclobenzaprine (FLEXERIL) 10 MG tablet Take 1 tablet (10 mg total) by mouth 2 (two) times daily as needed for muscle spasms. 04/02/22   Deatrice Spanbauer, Wells Guiles, PA-C  ibuprofen (ADVIL) 800 MG tablet Take 1 tablet (800 mg total) by mouth every 6 (six) hours as needed. 04/02/22   Aleisa Howk, Wells Guiles, PA-C  NONFORMULARY OR COMPOUNDED [place]: Onychomycosis nail lacquer - Fluconazole 2%, Terbinafine 1%, apply to affected area daily. 03/13/16   Edrick Kins, DPM  terbinafine (LAMISIL) 250 MG tablet Take 1 tablet (250 mg total) by mouth daily. 03/13/16   Edrick Kins, DPM    Family History Family History  Family history unknown: Yes    Social History Social History   Tobacco Use   Smoking status: Former    Packs/day: .5    Types: Cigarettes    Quit date: 08/20/2015    Years since quitting: 6.6   Smokeless tobacco: Never  Substance Use Topics   Alcohol use: Yes   Drug use: Not Currently    Types: Marijuana    Comment: Hx marijuana use per record     Allergies   Patient has no known allergies.   Review of Systems Review of Systems  Musculoskeletal:  Positive for back pain.   As per HPI  Physical  Exam Triage Vital Signs ED Triage Vitals [04/02/22 1830]  Enc Vitals Group     BP (!) 142/90     Pulse Rate 72     Resp 18     Temp 99.9 F (37.7 C)     Temp Source Oral     SpO2 99 %     Weight 210 lb (95.3 kg)     Height '5\' 7"'$  (1.702 m)     Head Circumference      Peak Flow      Pain Score 9     Pain Loc      Pain Edu?      Excl. in [place]?    No data found.  Updated Vital Signs BP (!) 142/90 (BP Location: Right Arm)   Pulse 72   Temp 99.9 F (37.7 C) (Oral)   Resp 18   Ht '5\' 7"'$  (1.702 m)   Wt 210 lb (95.3 kg)   SpO2 99%   BMI 32.89 kg/m    Physical Exam Vitals and nursing note reviewed.  Constitutional:      General: He is not in acute distress. HENT:     Mouth/Throat:     Pharynx: Oropharynx is clear.  Eyes:     Extraocular Movements: Extraocular movements intact.  Conjunctiva/sclera: Conjunctivae normal.     Pupils: Pupils are equal, round, and reactive to light.  Cardiovascular:     Rate and Rhythm: Normal rate and regular rhythm.     Heart sounds: Normal heart sounds.  Pulmonary:     Effort: Pulmonary effort is normal.     Breath sounds: Normal breath sounds.  Abdominal:     Palpations: Abdomen is soft.     Tenderness: There is no abdominal tenderness.  Musculoskeletal:        General: Normal range of motion.     Comments: No bony tenderness  Neurological:     General: No focal deficit present.     Mental Status: He is alert and oriented to person, place, and time.     Sensory: Sensation is intact.     Motor: Motor function is intact. No weakness.     Coordination: Coordination is intact.     Gait: Gait is intact.     Deep Tendon Reflexes: Reflexes are normal and symmetric.     Comments: Strength 5/5 all extremities      UC Treatments / Results  Labs (all labs ordered are listed, but only abnormal results are displayed) Labs Reviewed - No data to display  EKG   Radiology No results found.  Procedures Procedures (including  critical care time)  Medications Ordered in UC Medications  ketorolac (TORADOL) 30 MG/ML injection 30 mg (30 mg Intramuscular Given 04/02/22 1903)    Initial Impression / Assessment and Plan / UC Course  I have reviewed the triage vital signs and the nursing notes.  Pertinent labs & imaging results that were available during my care of the patient were reviewed by me and considered in my medical decision making (see chart for details).  Likely muscular injury from weightlifting No red flags today, no bony tenderness, no indication for x-ray imaging at this time IM Toradol given, sent muscle relaxer to use at home, advised avoiding strenuous activity. Return precautions discussed. Patient agrees to plan  Final Clinical Impressions(s) / UC Diagnoses   Final diagnoses:  Acute bilateral low back pain without sciatica     Discharge Instructions      You can take the muscle relaxer twice daily If this makes you drowsy take only at night  The injection today should provide adequate pain relief If you need additional medicine tomorrow, you can take 800 mg ibuprofen every 6 hours  Continue gentle stretching and avoid aggravating activity.     ED Prescriptions     Medication Sig Dispense Auth. Provider   cyclobenzaprine (FLEXERIL) 10 MG tablet  (Status: Discontinued) Take 1 tablet (10 mg total) by mouth 2 (two) times daily as needed for muscle spasms. 10 tablet Acel Natzke, PA-C   ibuprofen (ADVIL) 800 MG tablet  (Status: Discontinued) Take 1 tablet (800 mg total) by mouth every 6 (six) hours as needed. 21 tablet Lumen Brinlee, PA-C   cyclobenzaprine (FLEXERIL) 10 MG tablet Take 1 tablet (10 mg total) by mouth 2 (two) times daily as needed for muscle spasms. 10 tablet Azuree Minish, PA-C   ibuprofen (ADVIL) 800 MG tablet Take 1 tablet (800 mg total) by mouth every 6 (six) hours as needed. 21 tablet Ugochi Henzler, Wells Guiles, PA-C      PDMP not reviewed this encounter.   Christopherjame Carnell,  Vernice Jefferson 04/02/22 2021

## 2022-04-02 NOTE — ED Triage Notes (Signed)
Pt c/o lower back pain since Sunday, denies any fall or injury.

## 2022-04-02 NOTE — Discharge Instructions (Addendum)
You can take the muscle relaxer twice daily If this makes you drowsy take only at night  The injection today should provide adequate pain relief If you need additional medicine tomorrow, you can take 800 mg ibuprofen every 6 hours  Continue gentle stretching and avoid aggravating activity.
